# Patient Record
Sex: Male | Born: 1990 | Race: White | Hispanic: No | Marital: Single | Smoking: Former smoker
Health system: Southern US, Community
[De-identification: ages and names within clinical notes are randomized; demographics above are authoritative.]

## PROBLEM LIST (undated history)

## (undated) DIAGNOSIS — I10 Essential (primary) hypertension: Secondary | ICD-10-CM

## (undated) DIAGNOSIS — M549 Dorsalgia, unspecified: Secondary | ICD-10-CM

## (undated) DIAGNOSIS — M79606 Pain in leg, unspecified: Secondary | ICD-10-CM

## (undated) DIAGNOSIS — N186 End stage renal disease: Secondary | ICD-10-CM

## (undated) DIAGNOSIS — G8929 Other chronic pain: Secondary | ICD-10-CM

## (undated) DIAGNOSIS — N200 Calculus of kidney: Secondary | ICD-10-CM

## (undated) HISTORY — DX: Essential (primary) hypertension: I10

## (undated) HISTORY — PX: KIDNEY STONE SURGERY: SHX686

---
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
## 2008-03-05 ENCOUNTER — Inpatient Hospital Stay (HOSPITAL_COMMUNITY): Admission: EM | Admit: 2008-03-05 | Discharge: 2008-03-09 | Payer: Self-pay | Admitting: Emergency Medicine

## 2008-03-06 ENCOUNTER — Ambulatory Visit: Payer: Self-pay | Admitting: Gastroenterology

## 2008-03-07 ENCOUNTER — Ambulatory Visit: Payer: Self-pay | Admitting: Internal Medicine

## 2008-03-08 ENCOUNTER — Ambulatory Visit: Payer: Self-pay | Admitting: Internal Medicine

## 2008-06-12 ENCOUNTER — Emergency Department (HOSPITAL_COMMUNITY): Admission: EM | Admit: 2008-06-12 | Discharge: 2008-06-12 | Payer: Self-pay | Admitting: Emergency Medicine

## 2008-07-13 ENCOUNTER — Observation Stay (HOSPITAL_COMMUNITY): Admission: EM | Admit: 2008-07-13 | Discharge: 2008-07-15 | Payer: Self-pay | Admitting: Emergency Medicine

## 2009-07-28 ENCOUNTER — Emergency Department (HOSPITAL_COMMUNITY): Admission: EM | Admit: 2009-07-28 | Discharge: 2009-07-29 | Payer: Self-pay | Admitting: Emergency Medicine

## 2009-08-16 ENCOUNTER — Emergency Department (HOSPITAL_COMMUNITY): Admission: EM | Admit: 2009-08-16 | Discharge: 2009-08-16 | Payer: Self-pay | Admitting: Emergency Medicine

## 2009-09-04 ENCOUNTER — Emergency Department (HOSPITAL_COMMUNITY): Admission: EM | Admit: 2009-09-04 | Discharge: 2009-09-05 | Payer: Self-pay | Admitting: Emergency Medicine

## 2010-02-10 ENCOUNTER — Emergency Department (HOSPITAL_COMMUNITY): Admission: EM | Admit: 2010-02-10 | Discharge: 2010-02-10 | Payer: Self-pay | Admitting: Emergency Medicine

## 2010-08-04 LAB — URINALYSIS, ROUTINE W REFLEX MICROSCOPIC
Bilirubin Urine: NEGATIVE
Nitrite: NEGATIVE
Protein, ur: 100 mg/dL — AB
Specific Gravity, Urine: 1.025 (ref 1.005–1.030)

## 2010-08-04 LAB — URINE CULTURE: Colony Count: NO GROWTH

## 2010-08-04 LAB — URINE MICROSCOPIC-ADD ON

## 2010-08-05 LAB — URINALYSIS, ROUTINE W REFLEX MICROSCOPIC
Bilirubin Urine: NEGATIVE
Ketones, ur: NEGATIVE mg/dL
Leukocytes, UA: NEGATIVE
Nitrite: NEGATIVE
Urobilinogen, UA: 0.2 mg/dL (ref 0.0–1.0)

## 2010-08-05 LAB — CBC
Hemoglobin: 15.9 g/dL (ref 13.0–17.0)
MCV: 86 fL (ref 78.0–100.0)
RDW: 13.4 % (ref 11.5–15.5)
WBC: 10.9 10*3/uL — ABNORMAL HIGH (ref 4.0–10.5)

## 2010-08-05 LAB — DIFFERENTIAL
Basophils Relative: 0 % (ref 0–1)
Eosinophils Absolute: 0 10*3/uL (ref 0.0–0.7)
Eosinophils Relative: 0 % (ref 0–5)
Monocytes Absolute: 0.5 10*3/uL (ref 0.1–1.0)
Neutro Abs: 9.9 10*3/uL — ABNORMAL HIGH (ref 1.7–7.7)
Neutrophils Relative %: 91 % — ABNORMAL HIGH (ref 43–77)

## 2010-08-05 LAB — BASIC METABOLIC PANEL
CO2: 26 mEq/L (ref 19–32)
Calcium: 9.3 mg/dL (ref 8.4–10.5)
Chloride: 103 mEq/L (ref 96–112)
Creatinine, Ser: 2.15 mg/dL — ABNORMAL HIGH (ref 0.4–1.5)

## 2010-08-05 LAB — URINE MICROSCOPIC-ADD ON

## 2010-08-10 LAB — CBC
HCT: 44 % (ref 39.0–52.0)
Hemoglobin: 15 g/dL (ref 13.0–17.0)
MCV: 88.3 fL (ref 78.0–100.0)
RBC: 4.99 MIL/uL (ref 4.22–5.81)
RDW: 13.4 % (ref 11.5–15.5)
WBC: 7.2 10*3/uL (ref 4.0–10.5)

## 2010-08-10 LAB — URINALYSIS, ROUTINE W REFLEX MICROSCOPIC
Bilirubin Urine: NEGATIVE
Hgb urine dipstick: NEGATIVE
Specific Gravity, Urine: 1.015 (ref 1.005–1.030)
Urobilinogen, UA: 0.2 mg/dL (ref 0.0–1.0)

## 2010-08-10 LAB — POCT I-STAT, CHEM 8
Creatinine, Ser: 1.1 mg/dL (ref 0.4–1.5)
Glucose, Bld: 97 mg/dL (ref 70–99)
HCT: 47 % (ref 39.0–52.0)

## 2010-08-31 LAB — URINALYSIS, ROUTINE W REFLEX MICROSCOPIC
Bilirubin Urine: NEGATIVE
Glucose, UA: NEGATIVE mg/dL
Hgb urine dipstick: NEGATIVE
Ketones, ur: NEGATIVE mg/dL
Nitrite: NEGATIVE
Specific Gravity, Urine: 1.015 (ref 1.005–1.030)
Urobilinogen, UA: 0.2 mg/dL (ref 0.0–1.0)
pH: 8.5 — ABNORMAL HIGH (ref 5.0–8.0)

## 2010-08-31 LAB — COMPREHENSIVE METABOLIC PANEL
AST: 20 U/L (ref 0–37)
Albumin: 4.1 g/dL (ref 3.5–5.2)
BUN: 13 mg/dL (ref 6–23)
Calcium: 9.7 mg/dL (ref 8.4–10.5)
Creatinine, Ser: 0.96 mg/dL (ref 0.4–1.5)
GFR calc Af Amer: >60 mL/min (ref >60)
Total Protein: 7.3 g/dL (ref 6.0–8.3)

## 2010-08-31 LAB — CBC
HCT: 46.2 % (ref 39.0–52.0)
MCHC: 33.4 g/dL (ref 30.0–36.0)
MCV: 80.7 fL (ref 78.0–100.0)
Platelets: 246 10*3/uL (ref 150–400)
RDW: 17.4 % — ABNORMAL HIGH (ref 11.5–15.5)
WBC: 11.2 10*3/uL — ABNORMAL HIGH (ref 4.0–10.5)

## 2010-08-31 LAB — DIFFERENTIAL
Basophils Absolute: 0 10*3/uL (ref 0.0–0.1)
Eosinophils Relative: 1 % (ref 0–5)
Lymphocytes Relative: 3 % — ABNORMAL LOW (ref 12–46)
Lymphs Abs: 0.3 10*3/uL — ABNORMAL LOW (ref 0.7–4.0)
Monocytes Absolute: 0.4 10*3/uL (ref 0.1–1.0)
Neutro Abs: 10.5 10*3/uL — ABNORMAL HIGH (ref 1.7–7.7)

## 2010-08-31 LAB — LIPASE, BLOOD: Lipase: 19 U/L (ref 11–59)

## 2010-09-01 LAB — CBC
Hemoglobin: 13.4 g/dL (ref 13.0–17.0)
Hemoglobin: 14.1 g/dL (ref 13.0–17.0)
Hemoglobin: 8.8 g/dL — ABNORMAL LOW (ref 13.0–17.0)
MCHC: 33.3 g/dL (ref 30.0–36.0)
MCHC: 33.6 g/dL (ref 30.0–36.0)
Platelets: 222 10*3/uL (ref 150–400)
RBC: 3 MIL/uL — ABNORMAL LOW (ref 4.22–5.81)
RBC: 4.81 MIL/uL (ref 4.22–5.81)
RDW: 17.1 % — ABNORMAL HIGH (ref 11.5–15.5)
WBC: 7 10*3/uL (ref 4.0–10.5)
WBC: 9.2 10*3/uL (ref 4.0–10.5)

## 2010-09-01 LAB — IRON AND TIBC: TIBC: 332 ug/dL (ref 215–435)

## 2010-09-01 LAB — POCT I-STAT, CHEM 8
Calcium, Ion: 1.17 mmol/L (ref 1.12–1.32)
Creatinine, Ser: 1.3 mg/dL (ref 0.4–1.5)
Glucose, Bld: 85 mg/dL (ref 70–99)
HCT: 47 % (ref 39.0–52.0)
Hemoglobin: 16 g/dL (ref 13.0–17.0)
TCO2: 27 mmol/L (ref 0–100)

## 2010-09-01 LAB — URINALYSIS, ROUTINE W REFLEX MICROSCOPIC
Bilirubin Urine: NEGATIVE
Hgb urine dipstick: NEGATIVE
Nitrite: NEGATIVE
Specific Gravity, Urine: 1.015 (ref 1.005–1.030)
Urobilinogen, UA: 0.2 mg/dL (ref 0.0–1.0)
pH: 8 (ref 5.0–8.0)

## 2010-09-01 LAB — HEPATIC FUNCTION PANEL
ALT: 31 U/L (ref 0–53)
AST: 24 U/L (ref 0–37)
Albumin: 4.2 g/dL (ref 3.5–5.2)
Alkaline Phosphatase: 81 U/L (ref 39–117)
Bilirubin, Direct: 0.1 mg/dL (ref 0.0–0.3)
Total Bilirubin: 0.5 mg/dL (ref 0.3–1.2)

## 2010-09-01 LAB — TYPE AND SCREEN

## 2010-09-01 LAB — BASIC METABOLIC PANEL
Calcium: 8.7 mg/dL (ref 8.4–10.5)
Chloride: 108 mEq/L (ref 96–112)
Creatinine, Ser: 1.16 mg/dL (ref 0.4–1.5)
GFR calc Af Amer: >60 mL/min (ref >60)
Sodium: 141 mEq/L (ref 135–145)

## 2010-09-01 LAB — DIFFERENTIAL
Lymphs Abs: 2.1 10*3/uL (ref 0.7–4.0)
Lymphs Abs: 2.2 10*3/uL (ref 0.7–4.0)
Monocytes Absolute: 0.8 10*3/uL (ref 0.1–1.0)
Monocytes Relative: 6 % (ref 3–12)
Monocytes Relative: 8 % (ref 3–12)
Neutro Abs: 4 10*3/uL (ref 1.7–7.7)
Neutro Abs: 6.2 10*3/uL (ref 1.7–7.7)
Neutrophils Relative %: 58 % (ref 43–77)
Neutrophils Relative %: 67 % (ref 43–77)

## 2010-09-01 LAB — FOLATE: Folate: 13.8 ng/mL

## 2010-09-01 LAB — PREPARE RBC (CROSSMATCH)

## 2010-09-01 LAB — RETICULOCYTES: Retic Count, Absolute: 61.3 10*3/uL (ref 19.0–186.0)

## 2010-09-01 LAB — ABO/RH: ABO/RH(D): O POS

## 2010-09-16 ENCOUNTER — Emergency Department (HOSPITAL_COMMUNITY): Payer: Self-pay

## 2010-09-16 ENCOUNTER — Emergency Department (HOSPITAL_COMMUNITY)
Admission: EM | Admit: 2010-09-16 | Discharge: 2010-09-16 | Disposition: A | Discharge status: Home / Self Care | Payer: Self-pay | Attending: Emergency Medicine | Admitting: Emergency Medicine

## 2010-09-16 DIAGNOSIS — N200 Calculus of kidney: Secondary | ICD-10-CM | POA: Insufficient documentation

## 2010-09-16 DIAGNOSIS — K219 Gastro-esophageal reflux disease without esophagitis: Secondary | ICD-10-CM | POA: Insufficient documentation

## 2010-09-16 DIAGNOSIS — N390 Urinary tract infection, site not specified: Secondary | ICD-10-CM | POA: Insufficient documentation

## 2010-09-16 LAB — URINE MICROSCOPIC-ADD ON

## 2010-09-16 LAB — URINALYSIS, ROUTINE W REFLEX MICROSCOPIC
Glucose, UA: NEGATIVE mg/dL
Specific Gravity, Urine: 1.025 (ref 1.005–1.030)
pH: 5.5 (ref 5.0–8.0)

## 2010-09-18 LAB — URINE CULTURE: Culture: NO GROWTH

## 2010-09-21 ENCOUNTER — Emergency Department (HOSPITAL_COMMUNITY): Payer: Self-pay

## 2010-09-21 ENCOUNTER — Emergency Department (HOSPITAL_COMMUNITY)
Admission: EM | Admit: 2010-09-21 | Discharge: 2010-09-21 | Disposition: A | Discharge status: Other Acute Inpatient Hospital | Payer: Self-pay | Attending: Emergency Medicine | Admitting: Emergency Medicine

## 2010-09-21 DIAGNOSIS — N200 Calculus of kidney: Secondary | ICD-10-CM | POA: Insufficient documentation

## 2010-09-21 DIAGNOSIS — N19 Unspecified kidney failure: Secondary | ICD-10-CM | POA: Insufficient documentation

## 2010-09-21 DIAGNOSIS — R109 Unspecified abdominal pain: Secondary | ICD-10-CM | POA: Insufficient documentation

## 2010-09-21 LAB — BASIC METABOLIC PANEL
BUN: 46 mg/dL — ABNORMAL HIGH (ref 6–23)
Calcium: 10.1 mg/dL (ref 8.4–10.5)
Chloride: 104 mEq/L (ref 96–112)
Creatinine, Ser: 5.24 mg/dL — ABNORMAL HIGH (ref 0.4–1.5)
GFR calc Af Amer: 17 mL/min — ABNORMAL LOW (ref >60)

## 2010-09-21 LAB — DIFFERENTIAL
Basophils Absolute: 0 10*3/uL (ref 0.0–0.1)
Basophils Relative: 0 % (ref 0–1)
Eosinophils Absolute: 0.1 10*3/uL (ref 0.0–0.7)
Monocytes Absolute: 0.8 10*3/uL (ref 0.1–1.0)
Monocytes Relative: 8 % (ref 3–12)
Neutro Abs: 8.5 10*3/uL — ABNORMAL HIGH (ref 1.7–7.7)
Neutrophils Relative %: 82 % — ABNORMAL HIGH (ref 43–77)

## 2010-09-21 LAB — CBC
MCH: 27.7 pg (ref 26.0–34.0)
MCHC: 32.4 g/dL (ref 30.0–36.0)
Platelets: 302 10*3/uL (ref 150–400)
RBC: 5.09 MIL/uL (ref 4.22–5.81)

## 2010-09-29 NOTE — Consult Note (Signed)
NAMEPERLE, GIBBON NO.:  [id number]   MEDICAL RECORD NO.:  [id number]          PATIENT TYPE:  INP   LOCATION:  A336                          FACILITY:  [facility]   PHYSICIAN:  Dennie Maizes, M.D.   DATE OF BIRTH:  [birth year]   DATE OF CONSULTATION:  07/14/2008  DATE OF DISCHARGE:                                 CONSULTATION   REASON FOR CONSULTATION:  Right flank pain, bilateral renal calculi.   HISTORY OF PRESENT ILLNESS:  A 20 year old male has history of recurrent  urolithiasis.  He has had kidney stones for about 3 years.  He has been  under the care of Dr. Teofilo Pod at [facility].  He  has undergone bilateral percutaneous nephrostolithotomies in September  2009.  He had been recently admitted to the hospital with severe  intermittent right flank pain.  The patient denied having any voiding  difficulty, fever, chills, or gross hematuria at present.  He has been  evaluated with a noncontrast CT scan of the abdomen and pelvis.  This  revealed a 7.1-mm size right renal calculus and 19-mm size left renal  calculus.  There was no evidence of hydronephrosis.  No ureteral calculi  have been noted.   PAST MEDICAL HISTORY:  Bilateral large renal calculi status post  bilateral percutaneous nephrostolithotomies, history of panic attacks,  acid reflux, and depression.   MEDICATIONS:  Prilosec.   ALLERGIES:  None.   PHYSICAL EXAMINATION:  The patient is comfortable at the time of  examination.  Abdomen is soft.  No palpable flank mass.  Mild right  costovertebral angle tenderness is noted.  Bladder not palpable.  Penis  and testes are normal.   ADMISSION LABORATORY DATA:  CBC, WBC 7.0, hemoglobin 13.4, hematocrit  39.7.  BUN 14, creatinine 1.16.   IMPRESSION:  Right renal colic, bilateral renal calculi without  obstruction.   PLAN:  1. We will check an x-ray at KUB area  for migration of the stone.  2. We will discuss with the hospitalist  regarding management options.      The patient  plans to go to to [facility] for      further care.   Thanks for this consult.       Dennie Maizes, M.D.  Electronically Signed     SK/MEDQ  D:  07/14/2008  T:  07/14/2008  Job:  [id number]

## 2010-09-29 NOTE — Group Therapy Note (Signed)
NAMEBURNELL, Manuel NO.:  [id number]   MEDICAL RECORD NO.:  [id number]          PATIENT TYPE:  INP   LOCATION:  A336                          FACILITY:  [facility]   PHYSICIAN:  Dennie Maizes, M.D.   DATE OF BIRTH:  Feb 10, 1991   DATE OF PROCEDURE:  07/15/2008  DATE OF DISCHARGE:  07/15/2008                                 PROGRESS NOTE   This 20 year old male has a history of recurrent urolithiasis who has  undergone bilateral percutaneous nephrostolithotomy by Dr. Teofilo Pod at  [facility] last year.  He has been admitted to the  hospital ICU with right flank pain.  CT scan revealed a 7-mm sized stone  in the right kidney and 19-mm sized stone on the left kidney without  obstruction.  The patient has had some relief of pain today.  He still  has mild intermittent right flank pain.  He has not passed any stone in  the urine.   PHYSICAL EXAMINATION:  ABDOMEN:  Soft.   X-ray of the KUB area done today revealed a 20 x 19 mm sized left renal  calculus.  The right renal calculi was not seen.  No definite ureteral  calculi was noted.  The patient has 2 small phleboliths in the pelvic  cavity.   IMPRESSION:  Large left renal calculus, right flank pain, right renal  calculus nonobstructing.   PLAN:  The patient can be discharged with pain medication.  He will make  an appointment to see Dr. Teofilo Pod for followup of the left renal  calculus.      Dennie Maizes, M.D.  Electronically Signed     SK/MEDQ  D:  07/15/2008  T:  07/16/2008  Job:  [id number]

## 2010-09-29 NOTE — Discharge Summary (Signed)
Manuel Riggs, MENG NO.:  [id number]   MEDICAL RECORD NO.:  [id number]          PATIENT TYPE:  INP   LOCATION:  A308                          FACILITY:  [facility]   PHYSICIAN:  Osvaldo Shipper, MD     DATE OF BIRTH:  [birth year]   DATE OF ADMISSION:  03/05/2008  DATE OF DISCHARGE:  10/24/2009LH                               DISCHARGE SUMMARY   Please review H&P dictated by Dr. Skeet Latch for details regarding  the patient's presenting illness.   His PMD is unknown.   DISCHARGE DIAGNOSES:  1. Colitis.  Likely infectious, improved.  2. Nephrolithiasis under care of urologist at [facility].  3. History of panic attacks and depression.   BRIEF HOSPITAL COURSE:  Briefly, this is a 20 year old Caucasian male  who presented with complaints of abdominal pain.  He had undergone  nephrostomy placement at [facility] about 2 weeks ago.  The  patient was evaluated in the ED and he underwent CT scan of his abdomen  and pelvis, which showed nonobstructive bilateral renal calculi,  however, it also showed thickening of small bowel loops in the right  lower quadrant, which was suspicious for enteritis, colitis kind of  picture.  The patient was admitted to the hospital and was started on IV  antibiotics.  He was seen by gastroenterology.  He did not have any  diarrhea, however.  He did not mention that he was having diarrhea prior  to his presentation to the hospital.  His blood work show that his white  count was elevated at 12,900.  Otherwise rest of his labs were  unremarkable.  With antibiotics, his white count had come down to  normal.  His abdominal pain also slowly improved.  He was started on a  diet yesterday and he has tolerated that quite well.  He is this morning  denying any nausea, vomiting, or diarrhea.  So overall, the patient has  improved and if his BMET this morning is okay and if he is cleared by  GI, he can be discharged home.   His other  medical issues include nephrolithiasis for which he needs to  follow up with his physician's at [facility].  He also has  panic attack and depression as well as GERD.  All those issues were  stable.   On the day of discharge, he is feeling well.  No complaints were  offered.  Vital signs were all stable.  Examination was benign.  So, he  is stable for discharge.   DISCHARGE MEDICATIONS:  1. Cipro 500 mg p.o. b.i.d. for 7 days.  2. Flagyl 500 mg p.o. t.i.d. for 7 days.  3. Percocet 5/325 one tablet p.o. q.6 h. p.r.n. for pain, 20 tablets      prescribed.  4. Flora-Q 1 capsule once daily for 7 days.  5. Prilosec over-the-counter twice daily.  6. Prozac 20 mg once daily.   FOLLOWUP:  Follow up with GI in 4 weeks.  With his PMD and urologist as well within the next month or so.   DIET:  Low residue diet.   PHYSICAL ACTIVITY:  No restrictions.   Total time on this discharge, encountered about 40 minutes.      Osvaldo Shipper, MD  Electronically Signed     GK/MEDQ  D:  03/09/2008  T:  03/09/2008  Job:  [id number]   cc:   R. Roetta Sessions, M.D.  [id number]  [place]  [place] [address]

## 2010-09-29 NOTE — H&P (Signed)
Manuel Riggs, Manuel Riggs                 ACCOUNT NO.:  [id number]   MEDICAL RECORD NO.:  [id number]          PATIENT TYPE:  INP   LOCATION:  A336                          FACILITY:  [facility]   PHYSICIAN:  Margaretmary Dys, M.D.DATE OF BIRTH:  [birth year]   DATE OF ADMISSION:  07/13/2008  DATE OF DISCHARGE:  LH                              HISTORY & PHYSICAL   ADMISSION DIAGNOSES:  1. Acute abdominal pain.  2. History of staghorn kidney calculi status post ureterostomy tube      placement at [facility].  3. Questionable history of anemia with repeat H and H now normal.   HISTORY OF PRESENT ILLNESS:  Mr. Manuel Riggs is a 20 year old male who has a  history of staghorn calculus.  The patient apparently has had this since  he was 20 years old and had staghorn calculus and the patient had  ureterostomy tube placed in [facility].  The patient arrived  in the emergency room and complained of pain which has been worsening  over the past week.  The patient stated he had been taking some over-the-  counter pain medications but denies any narcotic use.  The patient's  pain is a stabbing pain mostly in his right upper quadrant and also his  right flank.  The pain waxes and wanes lasting from 5-10 minutes.  The  patient had had bilateral nephrostomies placed with complications of  pneumothorax which happened actually twice.  He had chest tubes placed  on the left side.  The patient describes pain and occasional colicky  with crampy.  The patient denies any nausea or vomiting but in general  has had a very poor appetite.  When the patient was evaluated in the  emergency room the patient received some pain medication with apparently  helped him.  However, H and H obtained revealed a hemoglobin of 8 which  was a big drop from 14 several months ago.  As a result there was a  concern that the patient may have a gastrointestinal bleed.  A rectal  exam was negative with no occult blood or gross blood seen.   However,  repeat subsequently after the patient was admitted was back to normal.   REVIEW OF SYSTEMS:  The patient denies any weight loss.  Mostly as  mentioned in history of present illness above he denies any frequency,  dysuria, urgency, no fever, no weight gains, no nausea or vomiting.  No  bloody stools.   PAST MEDICAL HISTORY:  1. 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  2. History of panic attacks.  3. Acid reflux.  4. Depression.   MEDICATIONS:  The patient takes Prilosec as needed.  He does not really  take it regularly.  A review of his records indicate that he was  supposed to be on Prozac and he has not taken this for awhile.   ALLERGIES:  He denies any known drug allergies.   SOCIAL HISTORY:  The patient is single, lives at home, drinks  occasionally some beer, uses occasional marijuana, smokes about  2-3  cigarettes daily.  The patient is currently not working.  He is looking  for work.  He is not in college.   PHYSICAL EXAM:  GENERAL:  The patient was conscious, alert, appeared to  be in some mild pain distress.  VITAL SIGNS:  Blood pressure was 117/73 with a pulse of 76, respirations  20, temperature 97.7 degrees Fahrenheit, oxygen saturation was 96% on  room air.  HEENT:  Normocephalic, atraumatic.  Oral mucosa was moist with no  exudates.  NECK:  Supple.  No JVD or lymphadenopathy.  LUNGS:  Were clear clinically with good air entry bilaterally.  HEART:  S1-S2 regular.  No S3, S4, gallops or rubs.  ABDOMEN:  Abdomen was soft.  The patient had some mild tenderness in the  right renal angle.  No rebound.  No guarding.  No rigidity.  Bowel  sounds were positive.  EXTREMITIES:  No edema.  CNS:  Exam was grossly intact with no focal neurological deficits.   LABORATORY/DIAGNOSTIC DATA:  White blood count 6.5, hemoglobin of 14,  hematocrit 42, platelet count was 222 with 67% neutrophils.  Sodium is  142,  potassium 3.8, chloride of 105, CO2 is 85, BUN of 18, creatinine  was 1.3, AST was 24, ALT was 31, albumin was 4.2.  Urinalysis was  negative.   ASSESSMENT:  Mr. Manuel Riggs is a 20 year old male with history of staghorn  calculus stone who is status post bilateral nephrostomy who presents  with acute abdominal pain.  A CT scan of his abdomen and pelvis shows  bilateral nephrolithiasis but no evidence of obstructive uropathy.  There were no acute pelvic CT changes.   PLAN:  1. The patient will be admitted for pain control.  2. The patient will be transferred back to [facility] if the patient continues to have any significant pain      although I have assured him he does not have an obstructive      calculus at this time.  3. Will place the patient on DVT prophylaxis with Lovenox.  4. Will discontinue type and cross match screen ordered in the      emergency room and discontinue gastrointestinal consult.  5. The patient is currently in observation status and I think may be      discharged in the next 24 hours if pain improves.  6. It is entirely possible that the patient may be having occasional      passage of some of his stones causing his symptoms.  We will see if      he will benefit from a urology consult here locally.  7. I explained the above plan to the patient in detail.      Margaretmary Dys, M.D.  Electronically Signed     AM/MEDQ  D:  07/13/2008  T:  07/13/2008  Job:  [id number]

## 2010-09-29 NOTE — Consult Note (Signed)
NAMESAWYER, MENTZER NO.:  [id number]   MEDICAL RECORD NO.:  [id number]          PATIENT TYPE:  INP   LOCATION:  A308                          FACILITY:  [facility]   PHYSICIAN:  Kassie Mends, M.D.      DATE OF BIRTH:  [birth year]   DATE OF CONSULTATION:  03/06/2008  DATE OF DISCHARGE:                                 CONSULTATION   DATE OF SERVICE:  March 06, 2008.   REASON FOR CONSULTATION:  Abdominal pain.   HISTORY OF PRESENT ILLNESS:  Mr. Vayda is a 20 year old male who  presented with sudden onset of right lower quadrant pain.  He describes  it as sharp.  It does not radiate.  He denies fever or chills.  He had 1  episode of diarrhea.  He has vomited 3 times.  He saw no blood in his  vomit.  His nausea has been mild, but the same since the onset of his  symptoms.  He denies using ibuprofen, Motrin, BC's are Goody powders.  He has had no bowel movement today.  He denies any sores in his mouth or  rash on his legs.  He has no heartburn or indigestion.  He has not seen  any blood in his stool.  He has not traveled anywhere.  He does have  well water.  He was hospitalized approximately 3 weeks ago.   PAST MEDICAL HISTORY:  1. Kidney stones.  2. Panic disorder.  3. Depression.   PAST SURGICAL HISTORY:  Bilateral nephrostomy tubes for bilateral  staghorn calculi.   ALLERGIES:  NO KNOWN DRUG ALLERGIES.   MEDICATIONS:  1. Cipro.  2. Flagyl.  3. Lovenox.  4. Protonix  5. Dilaudid as needed for pain (4 mg on March 05, 2008).   SOCIAL HISTORY:  He does drink alcohol.  He occasionally uses marijuana.  He smokes 2-3 cigarettes a day.   FAMILY HISTORY:  He denies any family history of colon cancer, colon  polyps or inflammatory bowel disease.   REVIEW OF SYSTEMS:  Per the HPI, otherwise all systems are negative.   PHYSICAL EXAMINATION:  VITAL SIGNS:  T-max is 97.6, blood pressure  128/79, pulse 98, O2 sat 97% on room air.  GENERAL:  He is in no apparent  distress, alert and oriented x4.  HEENT:  Atraumatic, normocephalic.  Pupils are equal and reactive to  light.  Mouth:  No oral lesions.  Posterior pharynx without erythema or  exudate.  NECK:  Full range of motion.  No lymphadenopathy.  LUNGS:  Clear to auscultation bilaterally.  CARDIOVASCULAR:  Regular rhythm, no murmur, normal S1-S2.  ABDOMEN:  Bowel sounds are present, soft, mild tenderness to palpation  in the right upper quadrant and right lower quadrant without rebound or  guarding.  EXTREMITIES:  No cyanosis or edema.  SKIN:  He has no pretibial lesions.  NEURO:  He has no focal neurologic deficit.   LABORATORY DATA:  White count 12.9 down to 7.5, hemoglobin 11.2,  platelets 246.  His hepatic function panel is normal.  His UA is  negative.  No stool studies have been obtained.   RADIOGRAPHY:  CT scan of the abdomen and pelvis with contrast reveals  bilateral kidney stones.  He has bowel wall thickening of the terminal  ileum extending to the ileocecal valve.   ASSESSMENT:  Mr. Garriga is a 20 year old male with sudden onset of right  lower quadrant pain associated with diarrhea and vomiting.  The  differential diagnosis includes infectious ileitis or inflammatory bowel  disease.   Thank you for allowing me to see Mr. Ribas in consultation.  My  recommendations follow.   RECOMMENDATIONS:  1. May have clear liquids.  He may also have sherbet.  Would continue      the Cipro and the Flagyl.  2. I have encouraged Mr. Mittelman that if he has a bowel movement to let      the nursing staff know so that we can obtain stool studies.  3. If his symptoms do not improve, then would consider an      ileocolonoscopy.  4. Continue Protonix daily.   ADDENDUM [id number]:  Improved. OPV w/ SLM AFTER 04/03/08      Kassie Mends, M.D.  Electronically Signed     SM/MEDQ  D:  03/07/2008  T:  03/07/2008  Job:  [id number]

## 2010-09-29 NOTE — Discharge Summary (Signed)
Manuel Riggs, Manuel Riggs NO.:  [id number]   MEDICAL RECORD NO.:  [id number]          PATIENT TYPE:  INP   LOCATION:  A336                          FACILITY:  [facility]   PHYSICIAN:  Dorris Singh, DO    DATE OF BIRTH:  1990/11/11   DATE OF ADMISSION:  07/13/2008  DATE OF DISCHARGE:  03/01/2010LH                               DISCHARGE SUMMARY   PRIMARY CARE PHYSICIAN:  He has none.   RADIOLOGY TESTS THAT WERE DONE:  Include on the July 13, 2008, he  had a CT of the abdomen and pelvis which demonstrated bilateral  nephrolithiasis.  No evidence of obstructive uropathy and no acute  pelvic findings and he had a KUB of the abdomen today which showed large  left lower pole renal calculus measuring 2.1 x 3.1 cm.   His H and P was done by Dr. Sherle Poe.   ADMISSION DIAGNOSES:  1. Acute abdominal pain.  2. History of staghorn kidney calculi status post urostomy tube      placement at [facility].  3. Questionable history of anemia.   DISCHARGE DIAGNOSES:  1. Bilateral kidney stones, patient has a history of this.  2. Acid reflux.  3. History of panic attacks.  4. Depression.   HPI:  You can refer to the H and P.  He was admitted for the above  diagnoses.  He was placed on pain management and admitted to the service  of [facility].  A CT of the abdomen and pelvis showed that he did have a  current kidney stone.  He was placed on DVT and GI prophylaxis.  Neurology was consulted to see him.  Patient has been going to [facility]  for all of his urology care and Dr. Rito Ehrlich did come and see him and  recommend we continue the current management.  Patient did have a repeat  x-ray done, KUB, which you saw above results.  At this point in time, on  July 15, 2008, it was determined that he could be discharged to home.  He is to follow up with his [facility].  We can set up an  appointment for him if he so chooses and he is to continue his care  there if he has any other  issues due to the fact that they have been  taking care of him up until this time.   PLAN:  He will be discharged to home.  He is to follow up with his  [facility].  We will give him some pain medications, Darvocet-N  100, #20, and he is to follow back up with them or if he needs to be  readmitted we recommend that he go to [facility] due to his longstanding  history with them and the fact that they have all of his records there.   CONDITION:  Stable.   DISPOSITION:  Will be to home.      Dorris Singh, DO  Electronically Signed     CB/MEDQ  D:  07/15/2008  T:  07/15/2008  Job:  [id number]

## 2010-09-29 NOTE — Group Therapy Note (Signed)
NAME:  SIRE, POET NO.:  [id number]   MEDICAL RECORD NO.:  [id number]          PATIENT TYPE:  INP   LOCATION:  A336                          FACILITY:  [facility]   PHYSICIAN:  Margaretmary Dys, M.D.DATE OF BIRTH:  1990/10/31   DATE OF PROCEDURE:  07/14/2008  DATE OF DISCHARGE:                                 PROGRESS NOTE   SUBJECTIVE:  The patient feels a little bit better today, still  reporting his pain to be 9/10, mostly in the right renal area.  The  patient has not seen any blood in his urine.   OBJECTIVE:  GENERAL:  Conscious, alert, comfortable, not in acute  distress.  Well oriented in time, place, and person.  VITAL SIGNS:  Blood pressure 105/63 with a pulse of 74, respirations 18,  temperature 98.2 degrees Fahrenheit, oxygen saturation 92% on room air.  HEENT:  Normocephalic, atraumatic.  Oral mucosa was dry.  No exudates  were noted.  NECK:  Supple.  No JVD or lymphadenopathy.  LUNGS:  Clear clinically, good air entry bilaterally.  HEART:  S1-S2 regular, no S3, S4, gallops or rubs.  ABDOMEN:  Soft.  The patient has some tenderness in the right renal  angle.  No guarding or rigidity was noted.  CENTRAL NERVOUS SYSTEM:  Grossly intact.   LABORATORY/DIAGNOSTICS:  White blood cell count 7, hemoglobin 13.4,  hematocrit 39.7, platelet count 234 with no left shift.  Sodium is 141,  potassium is 4, chloride of 108, CO2 of 26, glucose 121, BUN of 14,  creatinine 1.16, calcium 8.7.   ASSESSMENT:  1. Bilateral nephrolithiasis, status post nephrostomy tube placement      at [facility].  2. Acute on chronic abdominal pain.   PLAN:  1. Will increase his IV fluids normal saline to 150 mL an hour.  2. Continue pain control with Dilaudid and Toradol.  3. Will request a urology consult to see him.  It is noted from his      previous CT scan that the patient had nonobstructive kidney stones      bilaterally.  However, perhaps the patient is trying  to pass those      stones.  We will see if urology can give [place] any input as to the      next course of action.  4. The patient otherwise remains stable.  Continue on DVT prophylaxis      with Lovenox.     Margaretmary Dys, M.D.  Electronically Signed    AM/MEDQ  D:  07/14/2008  T:  07/14/2008  Job:  [id number]

## 2010-09-29 NOTE — H&P (Signed)
**Note Manuel Riggs** [name], Manuel NO.:  [id number]   MEDICAL RECORD NO.:  [id number]          PATIENT TYPE:  INP   LOCATION:  A308                          FACILITY:  [facility]   PHYSICIAN:  Skeet Latch, DO    DATE OF BIRTH:  1991-04-14   DATE OF ADMISSION:  03/05/2008  DATE OF DISCHARGE:  LH                              HISTORY & PHYSICAL   CHIEF COMPLAINTS:  Abdominal pain.   HISTORY OF PRESENT ILLNESS:  This is a 20 year old Caucasian male who  presents with complaint of abdominal pain.  The patient states that  yesterday he was awoken with severe abdominal pain that he describes as  a stabbing pain.  The patient states that the pain waxes and wanes.  It  can last anywhere from 5 to 20 minutes at a time.  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  The patient  was seen at [facility] and had bilateral nephrostomies placed.  The patient states that during the hospital stay he had a pneumothorax  and had a chest tube placed.  The patient was then home for a few weeks  and started having this abdominal pain that started yesterday that he  describes as a 10 out of 10 in nature and is located in his right lower  abdomen area.  He also describes as colicky crampy-type of pain.  The  patient states that he has not eaten in 1-2 days also.   PAST MEDICAL HISTORY:  1. Positive for nephrolithiasis.  2. Panic attacks.  3. Acid reflux.  4. Depression.   SURGICAL HISTORY:  Staghorn calculus with bilateral nephrostomy.   SOCIAL HISTORY:  He is a social drinker.  Admits to occasional marijuana  use.  States that he smokes 2-3 cigarettes daily for last 2-3 years.   ALLERGIES:  No known drug allergies.   FAMILY HISTORY:  Unremarkable.   MEDICATIONS:  1. He takes Prilosec daily.  2. He takes Prozac 20 mg daily.   REVIEW OF SYSTEMS:  CONSTITUTIONAL:  No fever, weight gain, weight loss  or chills.  CARDIOVASCULAR:  Unremarkable.  RESPIRATORY:   Unremarkable.  GASTROINTESTINAL:  Positive for abdominal pain.  No nausea or vomiting.  No bloody stools.  MUSCULOSKELETAL:  Positive for some right flank pain.  GENITOURINARY:  No dysuria, urgency, frequency.  Other systems are  unremarkable.   PHYSICAL EXAM:  Well-nourished, well-hydrated, well-developed.  No acute  distress.  HEENT:  Head is atraumatic, normocephalic.  Eyes are PERRLA, EOMI.  NECK:  Soft, supple, nontender, nondistended, oral mucosa moist.  CARDIOVASCULAR:  Regular rate and rhythm.  No murmurs, rubs or gallops.  LUNGS:  Clear auscultation bilaterally.  No rales, rhonchi or wheezes.  ABDOMEN:  Soft.  He does have some right lower quadrant pain on deep  palpation.  No rigidity or guarding.  No organomegaly.  Positive bowel  sounds.  EXTREMITIES:  No clubbing, cyanosis or edema.  NEUROLOGIC:  Cranial nerves II-XII grossly intact.  Patient alert and  oriented x3.   LABS:  Urinalysis positive for ketones, no  nitrites or leukocytes are  noted.  Sodium 140, potassium 4.0, chloride 105, CO2 is 26, glucose 101,  BUN 11, creatinine 0.94.  Alkaline phosphatase 71, AST is 23, ALT is 39,  total protein 7.4.  White count is 12.9, hemoglobin 12.8, hematocrit  37.9, platelet count 317.   RADIOLOGIC STUDIES:  1. CT of his abdomen and pelvis showed nonobstructive bilateral renal      calculi with small left renal cyst and minimal renal scarring.      Pelvis showed thickening of small bowel loops in the right lower      quadrant, question of Crohn disease, infection, unlikely ischemia      in a patient of hisage.  2. No evidence of bowel obstruction or perforation.  3. Small amount of nonspecific pelvic fluid.  4. Question tiny calculus within the urinary bladder.   ASSESSMENT:  1. Abdominal pain.  2. Crohn disease versus an infectious process.  3. History of nephrolithiasis.  4. History of depression.  5. Leukocytosis.   PLAN:  1. The patient will be admitted to service  [facility] to a general      medical bed.  2. For his abdominal pain, the patient will be placed on IV pain      medication.  We will IV hydrate the patient also at this time.  The      patient will kept n.p.o. except for medications at this time with      sips of water.  3. Crohn disease versus an infectious process.  We will get a      gastroenterology consult at this time.  We will await their      recommendations.  The patient will placed on IV antibiotics      empirically also at this time.  4. For his depression, the patient will be placed on his Prozac dose      at this time.  5. Lastly we will get stool cultures as well as a Clostridium      difficile culture.  We will also get a blood culture x2.  The      patient will be placed on antiemetics and we will get a.m. labs.      Skeet Latch, DO  Electronically Signed     SM/MEDQ  D:  03/06/2008  T:  03/06/2008  Job:  [id number]

## 2010-09-29 NOTE — Group Therapy Note (Signed)
NAMEEDEM, TIEGS NO.:  [id number]   MEDICAL RECORD NO.:  [id number]          PATIENT TYPE:  INP   LOCATION:  A336                          FACILITY:  [facility]   PHYSICIAN:  Skeet Latch, DO    DATE OF BIRTH:  [birth year]   DATE OF PROCEDURE:  07/15/2008  DATE OF DISCHARGE:                                 PROGRESS NOTE   SUBJECTIVE:  Mr. Manuel Riggs states that he is feeling better.  The patient  is asleep at the time of my exam, very groggy but he states that he is  feeling better.  He is not complaining of any severe pain at this time.   OBJECTIVE:  VITAL SIGNS:  Temperature 99, pulse 79, respirations 20,  blood pressure 130/79, satting 97% on room air.  CARDIOVASCULAR: Regular rate and rhythm. No murmurs, rubs, or gallops.  LUNGS:  Clear to auscultation bilaterally. No rales, rhonchi, or  wheezing.  ABDOMEN: Soft. No tenderness on deep palpation.  Positive bowel sounds.  EXTREMITIES:  No clubbing, cyanosis, or edema   LABS:  White count 7000, hemoglobin 13.4, hematocrit 39.7, platelet  count 234,000. Sodium 141, potassium 4, chloride 108, CO2 of 26, glucose  121, BUN 14, creatinine 1.16.   ASSESSMENT AND PLAN:  1. Bilateral nephrolithiasis status post nephrostomy tube place at      [facility].  Will continue with IV fluids as well as pain      control with Dilaudid and Toradol.  2. Acute and chronic abdominal pain.  Again will continue with pain      medications.  3. I believe urology to see the patient.  Will discuss with them      current treatment plans at this time. Will continue with DVT and GI      prophylaxis.  Anticipate the patient being discharged fairly soon      if no intervention is needed.      Skeet Latch, DO  Electronically Signed     SM/MEDQ  D:  07/15/2008  T:  07/15/2008  Job:  [id number]

## 2010-10-01 ENCOUNTER — Emergency Department (HOSPITAL_COMMUNITY)
Admission: EM | Admit: 2010-10-01 | Discharge: 2010-10-01 | Disposition: A | Discharge status: Home / Self Care | Payer: Self-pay | Attending: Emergency Medicine | Admitting: Emergency Medicine

## 2010-10-01 DIAGNOSIS — R300 Dysuria: Secondary | ICD-10-CM | POA: Insufficient documentation

## 2010-10-01 DIAGNOSIS — N200 Calculus of kidney: Secondary | ICD-10-CM | POA: Insufficient documentation

## 2010-10-01 LAB — DIFFERENTIAL
Basophils Relative: 0 % (ref 0–1)
Eosinophils Absolute: 0.4 10*3/uL (ref 0.0–0.7)
Neutro Abs: 4.8 10*3/uL (ref 1.7–7.7)
Neutrophils Relative %: 67 % (ref 43–77)

## 2010-10-01 LAB — BASIC METABOLIC PANEL
CO2: 27 mEq/L (ref 19–32)
Chloride: 102 mEq/L (ref 96–112)
GFR calc Af Amer: 49 mL/min — ABNORMAL LOW (ref >60)
Potassium: 4.4 mEq/L (ref 3.5–5.1)
Sodium: 139 mEq/L (ref 135–145)

## 2010-10-01 LAB — CBC
Hemoglobin: 11.7 g/dL — ABNORMAL LOW (ref 13.0–17.0)
Platelets: 379 10*3/uL (ref 150–400)
RBC: 4.18 MIL/uL — ABNORMAL LOW (ref 4.22–5.81)
WBC: 7.2 10*3/uL (ref 4.0–10.5)

## 2011-01-10 ENCOUNTER — Emergency Department (HOSPITAL_COMMUNITY)
Admission: EM | Admit: 2011-01-10 | Discharge: 2011-01-10 | Disposition: A | Discharge status: Home / Self Care | Payer: Self-pay | Attending: Emergency Medicine | Admitting: Emergency Medicine

## 2011-01-10 DIAGNOSIS — F17200 Nicotine dependence, unspecified, uncomplicated: Secondary | ICD-10-CM | POA: Insufficient documentation

## 2011-01-10 DIAGNOSIS — M549 Dorsalgia, unspecified: Secondary | ICD-10-CM | POA: Insufficient documentation

## 2011-01-10 DIAGNOSIS — M79609 Pain in unspecified limb: Secondary | ICD-10-CM | POA: Insufficient documentation

## 2011-01-10 DIAGNOSIS — M436 Torticollis: Secondary | ICD-10-CM | POA: Insufficient documentation

## 2011-01-10 DIAGNOSIS — Z87442 Personal history of urinary calculi: Secondary | ICD-10-CM | POA: Insufficient documentation

## 2011-01-10 HISTORY — DX: Pain in leg, unspecified: M79.606

## 2011-01-10 HISTORY — DX: Other chronic pain: G89.29

## 2011-01-10 HISTORY — DX: Other chronic pain: M54.9

## 2011-01-10 HISTORY — DX: Calculus of kidney: N20.0

## 2011-01-10 MED ORDER — DIAZEPAM 5 MG PO TABS
5.0000 mg | ORAL_TABLET | Freq: Once | ORAL | Status: AC
  Administered 2011-01-10: 5 mg via ORAL
  Filled 2011-01-10: qty 1

## 2011-01-10 MED ORDER — CYCLOBENZAPRINE HCL 10 MG PO TABS: 10.0000 mg | ORAL_TABLET | Freq: Two times a day (BID) | ORAL | Status: AC | PRN

## 2011-01-10 MED ORDER — IBUPROFEN 800 MG PO TABS: 800.0000 mg | ORAL_TABLET | Freq: Three times a day (TID) | ORAL | Status: AC

## 2011-01-10 MED ORDER — IBUPROFEN 800 MG PO TABS
800.0000 mg | ORAL_TABLET | Freq: Once | ORAL | Status: AC
  Administered 2011-01-10: 800 mg via ORAL
  Filled 2011-01-10: qty 1

## 2011-01-10 NOTE — ED Notes (Signed)
Pt said he took a Vicodin 5mg  just before arrival to the ER.

## 2011-01-10 NOTE — ED Provider Notes (Signed)
History     CSN: [id number] Arrival date & time: 01/10/2011  2:27 AM  Chief Complaint  Patient presents with  . Torticollis   Patient is a 20 y.o. male presenting with musculoskeletal pain. The history is provided by the patient.  Muscle Pain This is a new problem. The current episode started 12 to 24 hours ago. The problem occurs constantly. The problem has not changed since onset.Pertinent negatives include no chest pain, no abdominal pain, no headaches and no shortness of breath. The symptoms are aggravated by twisting. The symptoms are relieved by nothing. He has tried nothing for the symptoms. The treatment provided no relief.   Woke up with R sided neck pain that hurts to move. No associated weakness or numbness. No h/o same, he denies any trauma and believes he slept on his neck wrong.  Past Medical History  Diagnosis Date  . Kidney calculi   . Chronic back pain   . Chronic leg pain     Past Surgical History  Procedure Date  . Kidney stone surgery     No family history on file.  History  Substance Use Topics  . Smoking status: Current Everyday Smoker    Types: Cigarettes  . Smokeless tobacco: Not on file  . Alcohol Use: No      Review of Systems  Constitutional: Negative for fever and chills.  HENT: Positive for neck pain. Negative for sore throat, trouble swallowing and voice change.   Eyes: Negative for pain.  Respiratory: Negative for shortness of breath.   Cardiovascular: Negative for chest pain.  Gastrointestinal: Negative for abdominal pain.  Genitourinary: Negative for dysuria.  Musculoskeletal: Negative for back pain.  Skin: Negative for rash.  Neurological: Negative for headaches.  All other systems reviewed and are negative.    Physical Exam  BP 143/82  Pulse 97  Temp(Src) 98.6 F (37 C) (Oral)  Resp 18  Ht 5\' 8"  (1.727 m)  Wt 163 lb (73.936 kg)  BMI 24.78 kg/m2  SpO2 98%  Physical Exam  Constitutional: He is oriented to person, place,  and time. He appears well-developed and well-nourished.  HENT:  Head: Normocephalic and atraumatic.  Eyes: Conjunctivae and EOM are normal. Pupils are equal, round, and reactive to light.  Neck: Full passive range of motion without pain. No tracheal deviation present. No thyromegaly present.       R trapezial muscle spasm and TTP, no erythema or edema, limited ROM 2/2 pain. No midline tenderness or deformity.   Cardiovascular: Normal rate, regular rhythm, S1 normal, S2 normal and intact distal pulses.   Pulmonary/Chest: Effort normal and breath sounds normal. No stridor.  Abdominal: Soft. Bowel sounds are normal. There is no tenderness. There is no CVA tenderness.  Musculoskeletal: Normal range of motion.  Lymphadenopathy:    He has no cervical adenopathy.  Neurological: He is alert and oriented to person, place, and time. He has normal strength and normal reflexes. No cranial nerve deficit or sensory deficit. He displays a negative Romberg sign. GCS eye subscore is 4. GCS verbal subscore is 5. GCS motor subscore is 6.       Normal Gait  Skin: Skin is warm and dry. No rash noted. No cyanosis. Nails show no clubbing.  Psychiatric: He has a normal mood and affect. His speech is normal and behavior is normal.    ED Course  Procedures  MDM Muscle relaxer and pain control for clinical muscle spasm R neck. Rx for same. No clinical infection  or bony abnormality/ injury. neurovascularily intact.      Sunnie Nielsen, MD 01/10/11 [phone]

## 2011-01-10 NOTE — ED Notes (Signed)
Pt complains of neck pain, says he awoke from sleeping on his stomach and felt like someone was twisting his head around.  Denies numbnessor tingling

## 2011-01-10 NOTE — ED Notes (Signed)
Woke with sore neck yesterday morning, denies any known injury

## 2011-01-17 ENCOUNTER — Encounter (HOSPITAL_COMMUNITY): Payer: Self-pay | Admitting: Emergency Medicine

## 2011-01-17 DIAGNOSIS — F17200 Nicotine dependence, unspecified, uncomplicated: Secondary | ICD-10-CM | POA: Insufficient documentation

## 2011-01-17 DIAGNOSIS — H53149 Visual discomfort, unspecified: Secondary | ICD-10-CM | POA: Insufficient documentation

## 2011-01-17 DIAGNOSIS — R51 Headache: Secondary | ICD-10-CM | POA: Insufficient documentation

## 2011-01-17 DIAGNOSIS — M549 Dorsalgia, unspecified: Secondary | ICD-10-CM | POA: Insufficient documentation

## 2011-01-17 DIAGNOSIS — Z87442 Personal history of urinary calculi: Secondary | ICD-10-CM | POA: Insufficient documentation

## 2011-01-17 DIAGNOSIS — M79609 Pain in unspecified limb: Secondary | ICD-10-CM | POA: Insufficient documentation

## 2011-01-17 NOTE — ED Notes (Signed)
Patient c/o headache x 1 hour.  Denies N/V.

## 2011-01-18 ENCOUNTER — Emergency Department (HOSPITAL_COMMUNITY)
Admission: EM | Admit: 2011-01-18 | Discharge: 2011-01-18 | Disposition: A | Discharge status: Home / Self Care | Payer: Self-pay | Attending: Emergency Medicine | Admitting: Emergency Medicine

## 2011-01-18 ENCOUNTER — Encounter (HOSPITAL_COMMUNITY): Payer: Self-pay | Admitting: Emergency Medicine

## 2011-01-18 DIAGNOSIS — R51 Headache: Secondary | ICD-10-CM

## 2011-01-18 MED ORDER — METOCLOPRAMIDE HCL 5 MG/ML IJ SOLN
10.0000 mg | Freq: Once | INTRAMUSCULAR | Status: AC
  Administered 2011-01-18: 10 mg via INTRAMUSCULAR
  Filled 2011-01-18: qty 2

## 2011-01-18 MED ORDER — DIPHENHYDRAMINE HCL 25 MG PO CAPS
25.0000 mg | ORAL_CAPSULE | Freq: Once | ORAL | Status: AC
  Administered 2011-01-18: 25 mg via ORAL
  Filled 2011-01-18: qty 1

## 2011-01-18 MED ORDER — MORPHINE SULFATE 10 MG/ML IJ SOLN
6.0000 mg | Freq: Once | INTRAMUSCULAR | Status: AC
  Administered 2011-01-18: 6 mg via INTRAMUSCULAR
  Filled 2011-01-18: qty 1

## 2011-01-18 NOTE — ED Provider Notes (Addendum)
History     CSN: [id number] Arrival date & time: 01/18/2011 12:20 AM  Chief Complaint  Patient presents with  . Headache   HPI Comments: Patient c/o gradual onset of frontal headache this evening.  States that he has headaches intermittently but usually improve with Tylenol.  States he took two tylenol tablets approx 2 hrs prior to Ed arrival.  He also reports photophobia and sensitivity to sound.  He denies fever, neck stiffness, vomiting, numbness or weakness.  Patient is a 20 y.o. male presenting with headaches. The history is provided by the patient.  Headache  This is a new problem. The current episode started 1 to 2 hours ago. The problem occurs constantly. The problem has not changed since onset.The headache is associated with bright light. The pain is located in the frontal region. The quality of the pain is described as throbbing. The pain is moderate. The pain does not radiate. Pertinent negatives include no anorexia, no fever, no malaise/fatigue, no chest pressure, no near-syncope, no orthopnea, no palpitations, no syncope, no shortness of breath, no nausea and no vomiting. He has tried acetaminophen for the symptoms. The treatment provided no relief.    Past Medical History  Diagnosis Date  . Kidney calculi   . Chronic back pain   . Chronic leg pain     Past Surgical History  Procedure Date  . Kidney stone surgery     History reviewed. No pertinent family history.  History  Substance Use Topics  . Smoking status: Current Everyday Smoker    Types: Cigarettes  . Smokeless tobacco: Not on file  . Alcohol Use: No      Review of Systems  Constitutional: Negative for fever and malaise/fatigue.  HENT: Negative for neck pain and neck stiffness.   Eyes: Positive for photophobia. Negative for pain and visual disturbance.  Respiratory: Negative for shortness of breath.   Cardiovascular: Negative for palpitations, orthopnea, syncope and near-syncope.  Gastrointestinal:  Negative for nausea, vomiting and anorexia.  Musculoskeletal: Negative.   Neurological: Positive for headaches. Negative for dizziness, syncope, facial asymmetry, speech difficulty, weakness and numbness.  Hematological: Negative for adenopathy.  All other systems reviewed and are negative.    Physical Exam  BP 159/101  Pulse 108  Temp(Src) 98.8 F (37.1 C) (Oral)  Resp 18  Ht 5\' 8"  (1.727 m)  Wt 163 lb (73.936 kg)  BMI 24.78 kg/m2  SpO2 99%  Physical Exam  Nursing note and vitals reviewed. Constitutional: He is oriented to person, place, and time. He appears well-developed and well-nourished. No distress.  HENT:  Head: Normocephalic and atraumatic.  Mouth/Throat: Oropharynx is clear and moist.  Eyes: Conjunctivae and EOM are normal. Pupils are equal, round, and reactive to light.  Neck: Normal range of motion. Neck supple. No spinous process tenderness and no muscular tenderness present. Normal range of motion present. No Brudzinski's sign and no Kernig's sign noted. No thyromegaly present.  Cardiovascular: Normal rate, regular rhythm and normal heart sounds.   Pulmonary/Chest: Effort normal and breath sounds normal.  Abdominal: Soft. He exhibits no distension. There is no tenderness. There is no rebound and no guarding.  Musculoskeletal: Normal range of motion.  Lymphadenopathy:    He has no cervical adenopathy.  Neurological: He is alert and oriented to person, place, and time. He has normal strength and normal reflexes. He displays normal reflexes. No cranial nerve deficit or sensory deficit. He exhibits normal muscle tone. Coordination and gait normal.  Skin: Skin is warm and  dry.    ED Course  Procedures  MDM  1:07 AM patient is resting, NAD.  Vitals signs and nursing notes were reviewed.  Pt is non-toxic appearing.  No focal neuro deficits, no meningeal signs.  I have reviewed pt's previous ED charts.      MEDICATIONS GIVEN IN THE ED:   Medications    metoCLOPramide (REGLAN) injection 10 mg (10 mg Intramuscular Given 01/18/11 0118)  diphenhydrAMINE (BENADRYL) capsule 25 mg (25 mg Oral Given 01/18/11 0118)  morphine injection 6 mg (6 mg Intramuscular Given 01/18/11 0118)     The patient appears reasonably screened and/or stabilized for discharge and I doubt any other medical condition or other [facility] requiring further screening, evaluation, or treatment in the ED at this time prior to discharge.   Pt feels improved after observation and/or treatment in ED.     Tammy L. Trisha Mangle, PA Medical screening examination/treatment/procedure(s) were performed by non-physician practitioner and as supervising physician I was immediately available for consultation/collaboration.01/18/11 0143  Nicoletta Dress. Colon Branch, MD 02/01/11 [phone]

## 2011-02-15 LAB — CBC
HCT: 32.3 — ABNORMAL LOW
HCT: 37.9 — ABNORMAL LOW
Hemoglobin: 10.9 — ABNORMAL LOW
Hemoglobin: 12.8 — ABNORMAL LOW
MCHC: 33
MCHC: 33.2
MCHC: 33.6
MCV: 79.6
MCV: 80.2
MCV: 80.2
MCV: 80.4
Platelets: 246
RBC: 4.06 — ABNORMAL LOW
RBC: 4.36
RBC: 4.72
RDW: 14.9
WBC: 12.9 — ABNORMAL HIGH

## 2011-02-15 LAB — COMPREHENSIVE METABOLIC PANEL
AST: 19
AST: 23
Albumin: 3.2 — ABNORMAL LOW
CO2: 26
Calcium: 9.1
Chloride: 105
Creatinine, Ser: 0.94
Creatinine, Ser: 0.94
GFR calc Af Amer: >60
GFR calc Af Amer: >60
GFR calc non Af Amer: >60
GFR calc non Af Amer: >60
Glucose, Bld: 101 — ABNORMAL HIGH
Total Bilirubin: 0.7

## 2011-02-15 LAB — BASIC METABOLIC PANEL
BUN: 1 — ABNORMAL LOW
BUN: 2 — ABNORMAL LOW
CO2: 25
CO2: 27
Calcium: 9.2
Chloride: 107
Chloride: 110
Creatinine, Ser: 0.93
Creatinine, Ser: 1.05
GFR calc Af Amer: >60
GFR calc Af Amer: >60
GFR calc non Af Amer: >60
Glucose, Bld: 82
Glucose, Bld: 90
Potassium: 3.6
Sodium: 140

## 2011-02-15 LAB — DIFFERENTIAL
Basophils Absolute: 0
Basophils Absolute: 0
Basophils Relative: 1
Basophils Relative: 1
Eosinophils Absolute: 0
Eosinophils Absolute: 0.2
Eosinophils Absolute: 0.3
Eosinophils Relative: 0
Eosinophils Relative: 6 — ABNORMAL HIGH
Eosinophils Relative: 6 — ABNORMAL HIGH
Lymphocytes Relative: 27
Lymphocytes Relative: 6 — ABNORMAL LOW
Lymphs Abs: 2
Monocytes Absolute: 0.4
Monocytes Absolute: 0.5
Monocytes Relative: 8
Monocytes Relative: 8
Neutrophils Relative %: 51
Neutrophils Relative %: 64
Neutrophils Relative %: 90 — ABNORMAL HIGH

## 2011-02-15 LAB — URINALYSIS, ROUTINE W REFLEX MICROSCOPIC
Ketones, ur: 15 — AB
Nitrite: NEGATIVE
Protein, ur: NEGATIVE
pH: 6.5

## 2011-02-15 LAB — CULTURE, BLOOD (ROUTINE X 2): Culture: NO GROWTH

## 2011-03-01 LAB — DIFFERENTIAL
Basophils Absolute: 0
Eosinophils Absolute: 0.3
Eosinophils Relative: 5
Lymphs Abs: 2.1

## 2011-03-01 LAB — CBC
HCT: 43.5
MCHC: 34.1
MCV: 84.7
Platelets: 283
RDW: 13.6

## 2011-03-01 LAB — URINALYSIS, ROUTINE W REFLEX MICROSCOPIC
Bilirubin Urine: NEGATIVE
Ketones, ur: NEGATIVE
Nitrite: NEGATIVE
Specific Gravity, Urine: 1.015
Urobilinogen, UA: 0.2

## 2011-03-01 LAB — BASIC METABOLIC PANEL
BUN: 12
Chloride: 105
Glucose, Bld: 94
Potassium: 4.4

## 2011-03-01 LAB — LIPASE, BLOOD: Lipase: 18

## 2011-09-01 IMAGING — CT CT ABD-PELV W/O CM
3 of 4 series · 8 of 46 positions shown, 15 images · non-contrast
Comparison: CT abdomen pelvis 07/13/2008 and abdominal radiograph
07/15/2008

CLINICAL DATA: Right flank pain.  History of renal stones.

CT ABDOMEN AND PELVIS WITHOUT CONTRAST
TECHNIQUE: Multidetector CT imaging of the abdomen and pelvis was
performed following the standard protocol without intravenous
contrast.

[Series 3: lung 5.0 b60f · axial · 0.70mm/px · z∈[-141,-81]mm · 4 of 22 slices shown, 9 images]
[im 5/22  soft-tissue]
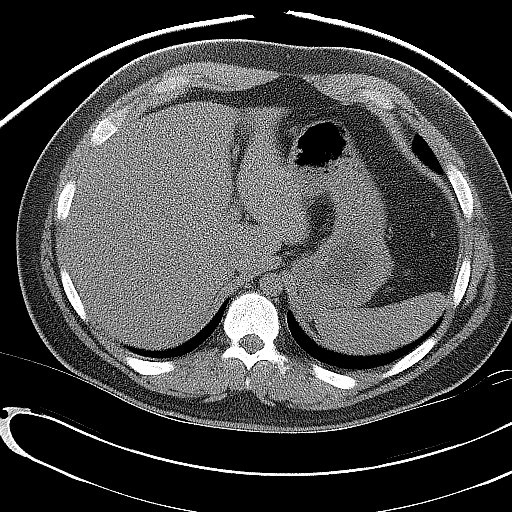
[im 5/22  lung]
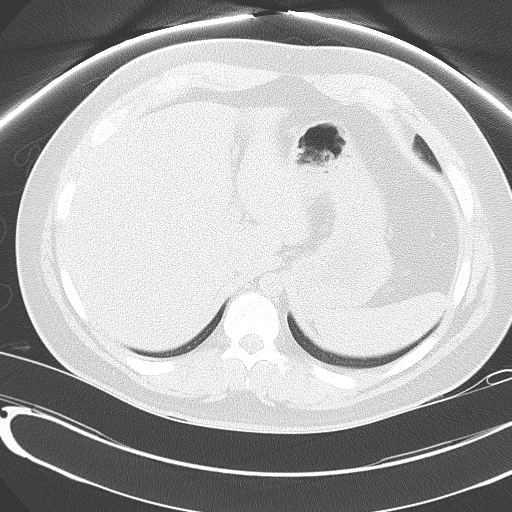
[im 5/22  bone]
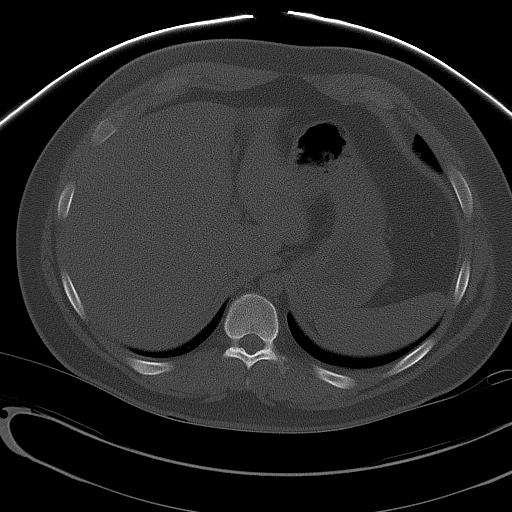
[im 9/22  soft-tissue]
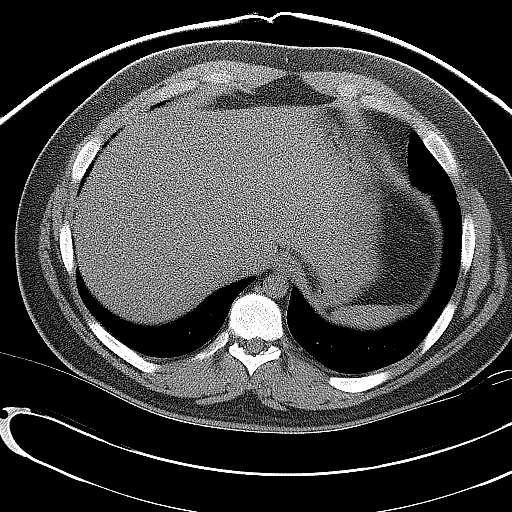
[im 9/22  lung]
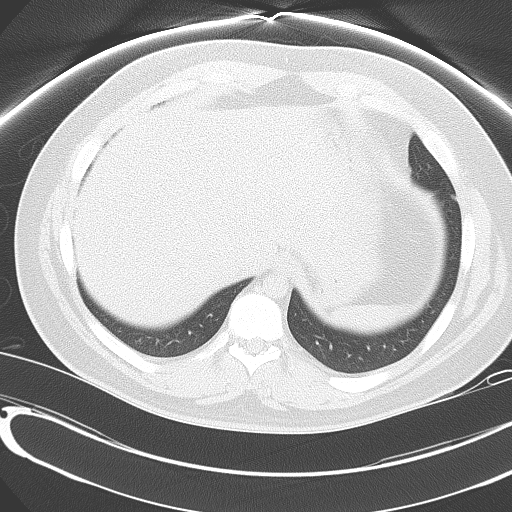
[im 13/22  soft-tissue]
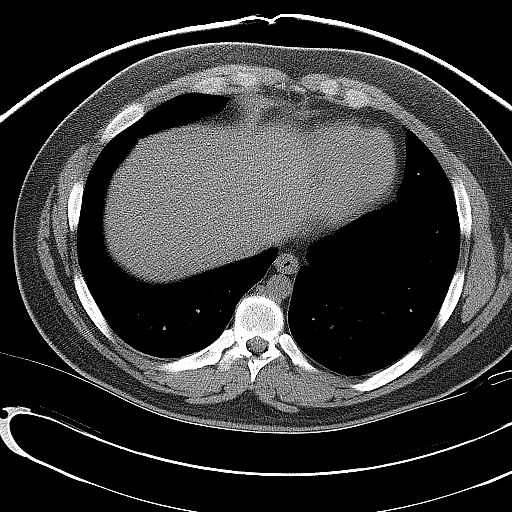
[im 13/22  lung]
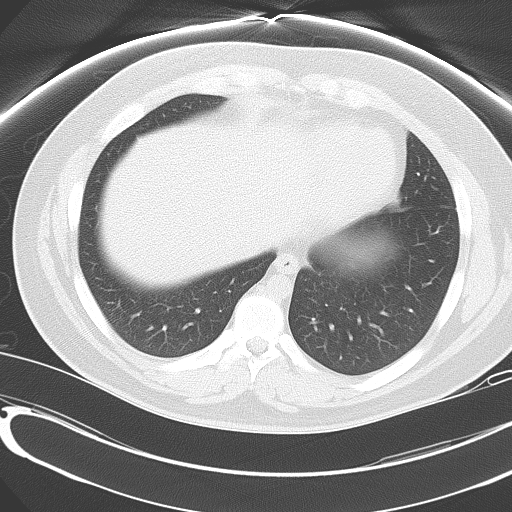
[im 17/22  soft-tissue]
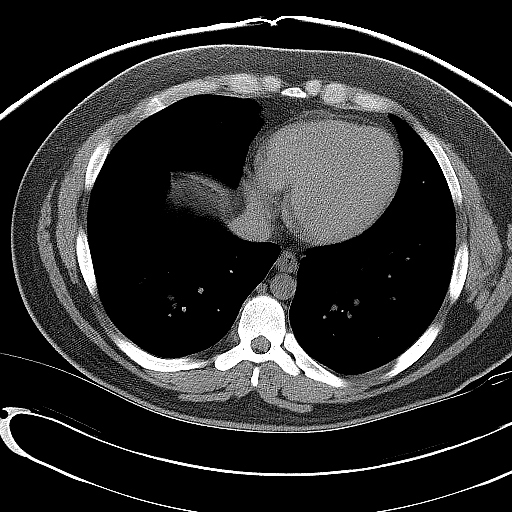
[im 17/22  lung]
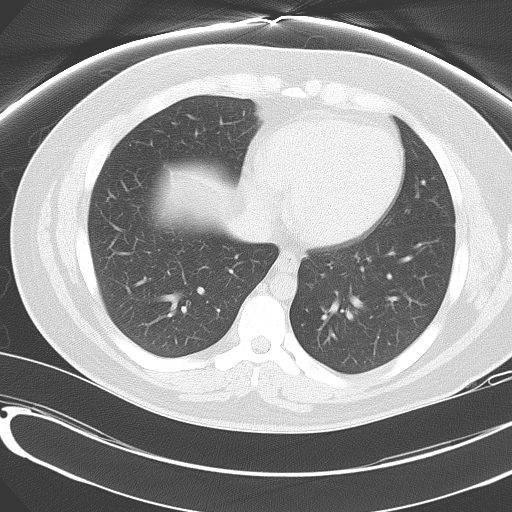

[Series 4: mpr coronal (id) · coronal · 0.81mm/px · 3 of 75 slices shown, 4 images]
[im 25/75  soft-tissue]
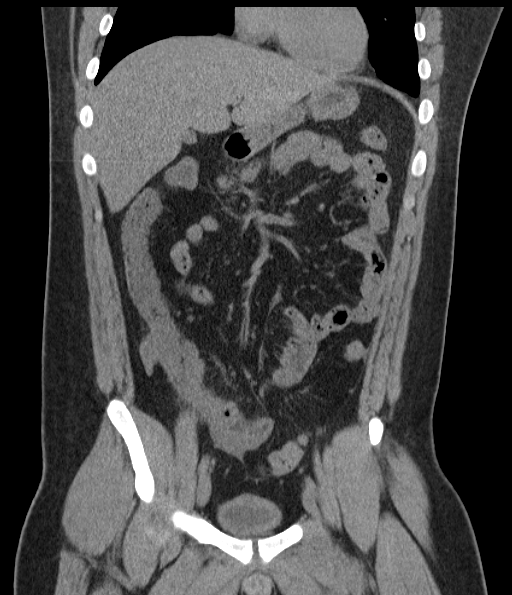
[im 33/75  soft-tissue]
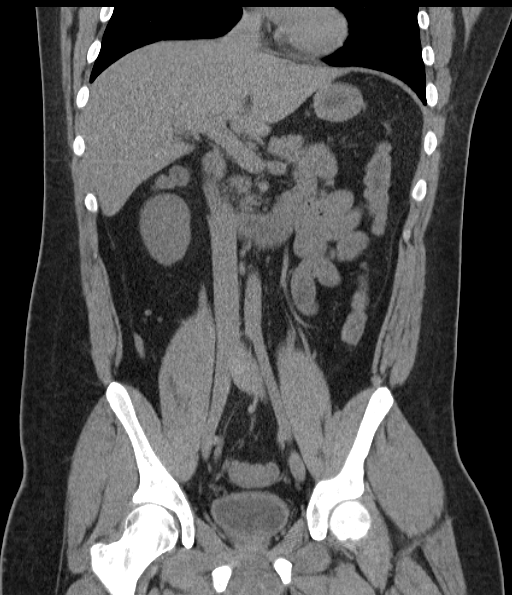
[im 33/75  bone]
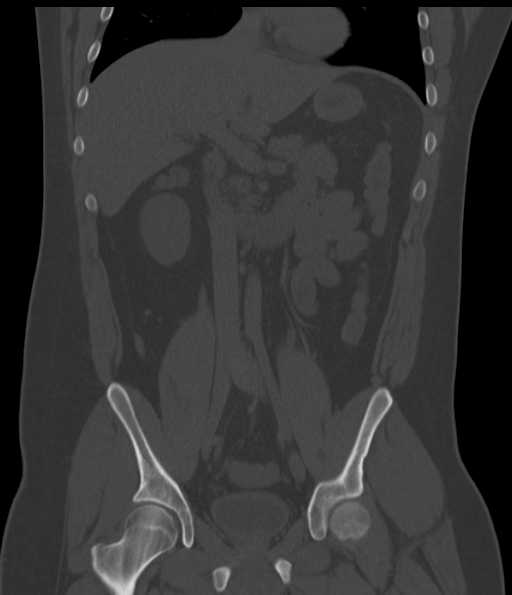
[im 42/75  soft-tissue]
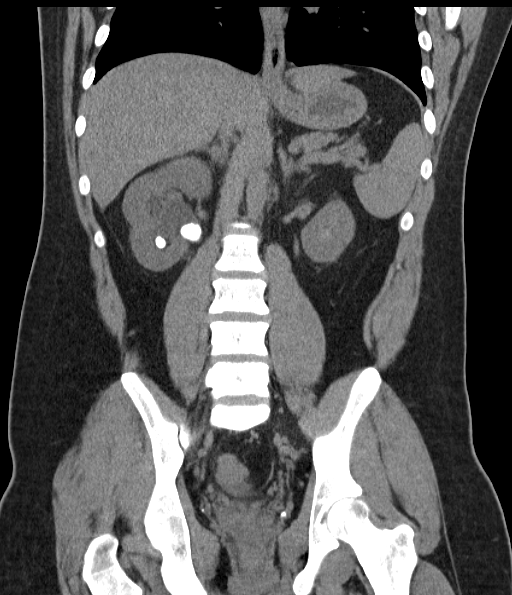

[Series 5: mpr sagittal (id) · sagittal · 0.63mm/px · 1 of 101 slices shown, 2 images]
[im 34/101  soft-tissue]
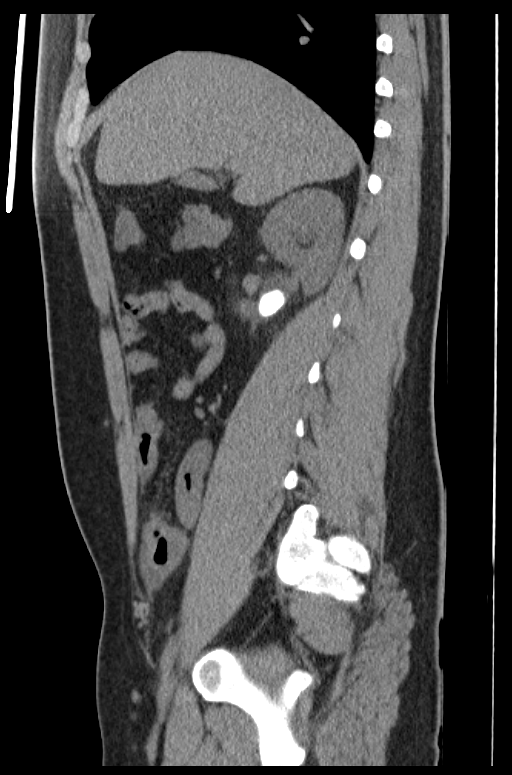
[im 34/101  bone]
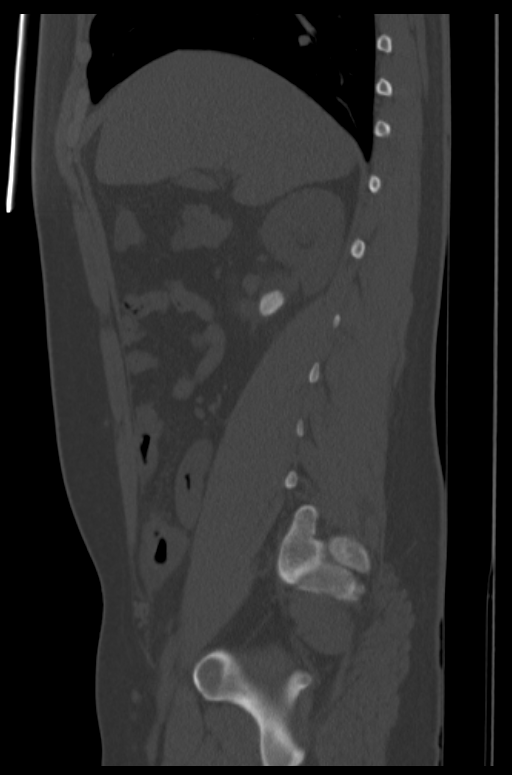

[8 of 46 positions shown; findings below may reference images not displayed]

FINDINGS: Lung bases are clear.

There is bilateral nephrolithiasis.  Stone burden in the right
kidney has increased since prior study.  Currently, there is a
dominant stone in the at the ureteropelvic junction on the right
that measures 2.0 x 1.4 cm is axial dimension and approximately
cm in craniocaudal span the results in moderate right
hydronephrosis.  There are additional stones in the collecting
system of the right kidney, with approximately eight stones
identified.  Aside from the stone in the ureteropelvic junction,
the next largest measures 9 mm greatest diameter.

There are several stones in the left kidney, that appear similar
compared to the prior stone study of 4757.  The largest stone is in
the lower pole and measures 9 x 6 mm.  There is no hydronephrosis.

Both ureters are normal in caliber.

The unenhanced appearance of the liver, gallbladder, this leaning,
adrenal glands, and pancreas is within normal limits.  The ureters
are normal in caliber.  The appendix is normal.

There is some wall thickening of the distal and terminal ileum
(example image #66 of series 2).  There is a small amount of
adjacent free fluid versus fascial thickening inferior of to these
loops. Additionally, there is a small amount of fluid in the left
aspect of the pelvis, versus fascial thickening (image #77). The
appearance of this distal bowel wall thickening and fluid or
thickening is essentially the same, as was seen in February 2008.

The urinary bladder is not very distended at the time imaging
unremarkable.  Normal appearance prostate gland and seminal
vesicles.  The colon is unremarkable.  There is no lymphadenopathy.
Visualized vertebral bodies are normal in appearance.  The
sacroiliac joints have normal appearances.
IMPRESSION: 1.  2.0 x 1.4 x 1.4 cm obstructing stone at the right ureteropelvic
junction, results in moderate right hydronephrosis. To
2.  Bilateral nephrolithiasis as described above.  There is no
obstruction on the left.

3.  Bowel wall thickening and adjacent fluid versus fascial
thickening in the distal and terminal ileum.  There is also a small
amount of fluid or thickening in the left aspect of the pelvis.
These findings are very similar to this CT of 6550 and suggest
either chronic or and/or recurrent small bowel inflammation.  Given
the patient's young age, this is suspicious for inflammatory bowel
disease. Consider follow-up gastroenterology consultation, if the
patient has not previously been evaluated for this finding.

## 2022-01-04 ENCOUNTER — Emergency Department (HOSPITAL_COMMUNITY)
Admission: EM | Admit: 2022-01-04 | Discharge: 2022-01-04 | Disposition: A | Discharge status: Home / Self Care | Payer: Self-pay | Attending: Emergency Medicine | Admitting: Emergency Medicine

## 2022-01-04 ENCOUNTER — Encounter (HOSPITAL_COMMUNITY): Payer: Self-pay

## 2022-01-04 ENCOUNTER — Emergency Department (HOSPITAL_COMMUNITY): Payer: Self-pay

## 2022-01-04 DIAGNOSIS — S39012A Strain of muscle, fascia and tendon of lower back, initial encounter: Secondary | ICD-10-CM | POA: Insufficient documentation

## 2022-01-04 DIAGNOSIS — X500XXA Overexertion from strenuous movement or load, initial encounter: Secondary | ICD-10-CM | POA: Insufficient documentation

## 2022-01-04 MED ORDER — METHOCARBAMOL 500 MG PO TABS
500.0000 mg | ORAL_TABLET | Freq: Once | ORAL | Status: AC
  Administered 2022-01-04: 500 mg via ORAL
  Filled 2022-01-04: qty 1

## 2022-01-04 MED ORDER — METHOCARBAMOL 500 MG PO TABS: 500.0000 mg | ORAL_TABLET | Freq: Three times a day (TID) | ORAL | 0 refills | Status: DC

## 2022-01-04 MED ORDER — OXYCODONE-ACETAMINOPHEN 5-325 MG PO TABS
1.0000 | ORAL_TABLET | Freq: Once | ORAL | Status: AC
  Administered 2022-01-04: 1 via ORAL
  Filled 2022-01-04: qty 1

## 2022-01-04 MED ORDER — HYDROMORPHONE HCL 1 MG/ML IJ SOLN
0.5000 mg | Freq: Once | INTRAMUSCULAR | Status: AC
  Administered 2022-01-04: 0.5 mg via INTRAMUSCULAR

## 2022-01-04 MED ORDER — HYDROMORPHONE HCL 1 MG/ML IJ SOLN
0.5000 mg | Freq: Once | INTRAMUSCULAR | Status: DC
  Filled 2022-01-04: qty 0.5

## 2022-01-04 MED ORDER — OXYCODONE-ACETAMINOPHEN 5-325 MG PO TABS: 1.0000 | ORAL_TABLET | ORAL | 0 refills | Status: DC | PRN

## 2022-01-04 NOTE — ED Triage Notes (Signed)
Pt states he went to go pick up heavy water jug when he felt a pop in his lower back.

## 2022-01-04 NOTE — ED Notes (Signed)
[place], PA and [place], [place] made aware of pts current BP (175/124).

## 2022-01-04 NOTE — ED Provider Notes (Signed)
  Physical Exam  BP (!) 175/128 (BP Location: Left Arm)   Pulse 99   Temp 99.1 F (37.3 C) (Oral)   Resp (!) 22   Ht 5\' 6"  (1.676 m)   Wt 84.5 kg   SpO2 97%   BMI 30.07 kg/m   Physical Exam  Procedures  Procedures  ED Course / MDM    Medical Decision Making Amount and/or Complexity of Data Reviewed Radiology: ordered.  Risk Prescription drug management.   ***

## 2022-01-04 NOTE — ED Provider Notes (Signed)
[facility] EMERGENCY DEPARTMENT Provider Note   CSN: [id number] Arrival date & time: 01/04/22  1625     History  Chief Complaint  Patient presents with   Back Pain    Manuel Riggs is a 31 y.o. male.   Back Pain Associated symptoms: no abdominal pain, no chest pain, no fever, no headaches, no numbness and no weakness         Manuel Riggs is a 31 y.o. male who presents to the Emergency Department complaining of sudden onset of left-sided low back pain.  He attempted to pick up a bucket of water and felt a "pop" in his lower back.  He states that he suddenly went dizzy and lost his vision for a few seconds.  Symptoms have since resolved but continues to have pain of his lower back.  Pain radiates to his upper left thigh.  Pain is worse with attempting to stand or walk, improves while laying supine.  He denies any numbness or weakness of his lower extremities, abdominal pain, urine or bowel changes.      Home Medications Prior to Admission medications   Medication Sig Start Date End Date Taking? Authorizing Provider  tiopronin (THIOLA) 100 MG tablet Take 100 mg by mouth 3 (three) times daily.      [provider]      Allergies    Aspirin    Review of Systems   Review of Systems  Constitutional:  Negative for appetite change, chills and fever.  Eyes:  Positive for visual disturbance.  Respiratory:  Negative for shortness of breath.   Cardiovascular:  Negative for chest pain.  Gastrointestinal:  Negative for abdominal pain, diarrhea, nausea and vomiting.  Musculoskeletal:  Positive for back pain. Negative for neck pain.  Neurological:  Positive for dizziness. Negative for weakness, light-headedness, numbness and headaches.    Physical Exam Updated Vital Signs BP (!) 182/120 (BP Location: Left Arm)   Pulse 99   Temp 99.1 F (37.3 C) (Oral)   Resp (!) 22   Ht 5\' 6"  (1.676 m)   Wt 84.5 kg   SpO2 97%   BMI 30.07 kg/m  Physical Exam Vitals and nursing  note reviewed.  Constitutional:      General: He is not in acute distress.    Appearance: Normal appearance. He is not ill-appearing or toxic-appearing.  HENT:     Mouth/Throat:     Mouth: Mucous membranes are moist.  Cardiovascular:     Rate and Rhythm: Normal rate and regular rhythm.     Pulses: Normal pulses.  Pulmonary:     Effort: Pulmonary effort is normal.     Breath sounds: Normal breath sounds.  Abdominal:     Palpations: Abdomen is soft.     Tenderness: There is no abdominal tenderness.  Musculoskeletal:        General: Tenderness and signs of injury present.     Lumbar back: Spasms and tenderness present. No swelling. Decreased range of motion. Positive left straight leg raise test.       Back:     Right lower leg: No edema.     Left lower leg: No edema.     Comments: Tender to palpation of the left lower lumbar paraspinal muscles.  No bony step-offs  Skin:    General: Skin is warm.     Capillary Refill: Capillary refill takes less than 2 seconds.  Neurological:     General: No focal deficit present.  Mental Status: He is alert.     Sensory: No sensory deficit.     Motor: No weakness.     ED Results / Procedures / Treatments   Labs (all labs ordered are listed, but only abnormal results are displayed) Labs Reviewed - No data to display  EKG None  Radiology DG Lumbar Spine Complete  Result Date: 01/04/2022 CLINICAL DATA:  Back injury. EXAM: LUMBAR SPINE - COMPLETE 4+ VIEW COMPARISON:  None Available. FINDINGS: There are 5 non-rib-bearing lumbar vertebra. The alignment is maintained. Vertebral body heights are normal. There is no listhesis. The posterior elements are intact. Disc spaces are preserved. No fracture, focal bone abnormality or pars defects. Sacroiliac joints are symmetric and normal. IMPRESSION: Negative radiographs of the lumbar spine. Electronically Signed   By: Narda Rutherford M.D.   On: 01/04/2022 18:43    Procedures Procedures     Medications Ordered in ED Medications  HYDROmorphone (DILAUDID) injection 0.5 mg (has no administration in time range)  oxyCODONE-acetaminophen (PERCOCET/ROXICET) 5-325 MG per tablet 1 tablet (1 tablet Oral Given 01/04/22 1822)  methocarbamol (ROBAXIN) tablet 500 mg (500 mg Oral Given 01/04/22 1822)    ED Course/ Medical Decision Making/ A&P                           Medical Decision Making Patient here for evaluation of left-sided low back pain after picking up a heavy bucket of water.  Felt a pop in his back.  He has pain with movement.  Pain radiates to left upper thigh only.  No extremity weakness.  On exam, patient appears uncomfortable he is grimacing on exam and holding his left lower back.  Able to bear weight without difficulty.  Ambulates with slow gait.  No ataxia.  No focal neurodeficits on exam.  Differential diagnosis would include but not limited to musculoskeletal injury, cauda equina, disc injury.  I suspect this is musculoskeletal, no red flags on exam to suggest cauda equina.  Will obtain x-ray and address his pain.  He is hypertensive here without history of hypertension, I suspect this is secondary to his level of pain.  Will medicate and reassess  Amount and/or Complexity of Data Reviewed Radiology: ordered.    Details: X-ray of the L-spine negative Discussion of management or test interpretation with external provider(s): Discussed x-ray findings with patient.  Likely musculoskeletal injury.  He is agreeable to symptomatic treatment.  I anticipate discharge home, prescription sent for pain medication and muscle relaxer.  Discussed with Parke Poisson, PA-C who agrees to dispo pt oncef BP improves.    Risk Prescription drug management.           Final Clinical Impression(s) / ED Diagnoses Final diagnoses:  Strain of lumbar region, initial encounter    Rx / DC Orders ED Discharge Orders     None         Rosey Bath 01/04/22  1929    Gloris Manchester, MD 01/05/22 [id number]

## 2022-01-04 NOTE — ED Notes (Signed)
ED Provider at bedside. 

## 2022-01-04 NOTE — Discharge Instructions (Addendum)
Alternate ice and heat to your lower back.  Avoid twisting, bending or heavy lifting for at least 1 week.  Take the medication as directed the prescribed medications may cause drowsiness.  Do not operate machinery or drive while taking these medications.  You may also try over-the-counter 4% lidocaine patches applied to the affected area.  Use as directed.  Follow-up with your primary care provider or with the orthopedic provider listed in 1 week if your symptoms or not improving.  Additionally please follow-up with the [facility] to have your blood pressure rechecked.

## 2022-11-07 ENCOUNTER — Inpatient Hospital Stay (HOSPITAL_COMMUNITY)
Admission: EM | Admit: 2022-11-07 | Discharge: 2022-11-11 | DRG: 683 | Disposition: A | Discharge status: Home / Self Care | Payer: 59 | Attending: Family Medicine | Admitting: Family Medicine

## 2022-11-07 ENCOUNTER — Other Ambulatory Visit: Payer: Self-pay

## 2022-11-07 ENCOUNTER — Emergency Department (HOSPITAL_COMMUNITY): Payer: 59

## 2022-11-07 ENCOUNTER — Encounter (HOSPITAL_COMMUNITY): Payer: Self-pay | Admitting: Emergency Medicine

## 2022-11-07 DIAGNOSIS — N1832 Chronic kidney disease, stage 3b: Secondary | ICD-10-CM | POA: Diagnosis present

## 2022-11-07 DIAGNOSIS — F17200 Nicotine dependence, unspecified, uncomplicated: Secondary | ICD-10-CM | POA: Insufficient documentation

## 2022-11-07 DIAGNOSIS — I160 Hypertensive urgency: Secondary | ICD-10-CM | POA: Diagnosis present

## 2022-11-07 DIAGNOSIS — N200 Calculus of kidney: Secondary | ICD-10-CM | POA: Diagnosis present

## 2022-11-07 DIAGNOSIS — I10 Essential (primary) hypertension: Secondary | ICD-10-CM | POA: Diagnosis not present

## 2022-11-07 DIAGNOSIS — I129 Hypertensive chronic kidney disease with stage 1 through stage 4 chronic kidney disease, or unspecified chronic kidney disease: Secondary | ICD-10-CM | POA: Diagnosis present

## 2022-11-07 DIAGNOSIS — N179 Acute kidney failure, unspecified: Principal | ICD-10-CM | POA: Diagnosis present

## 2022-11-07 DIAGNOSIS — F1290 Cannabis use, unspecified, uncomplicated: Secondary | ICD-10-CM | POA: Diagnosis present

## 2022-11-07 DIAGNOSIS — F17210 Nicotine dependence, cigarettes, uncomplicated: Secondary | ICD-10-CM | POA: Diagnosis present

## 2022-11-07 DIAGNOSIS — E872 Acidosis, unspecified: Secondary | ICD-10-CM | POA: Clinically undetermined

## 2022-11-07 DIAGNOSIS — I169 Hypertensive crisis, unspecified: Secondary | ICD-10-CM | POA: Insufficient documentation

## 2022-11-07 DIAGNOSIS — R059 Cough, unspecified: Secondary | ICD-10-CM | POA: Diagnosis not present

## 2022-11-07 MED ORDER — ONDANSETRON HCL 4 MG/2ML IJ SOLN
4.0000 mg | Freq: Once | INTRAMUSCULAR | Status: AC
  Administered 2022-11-07: 4 mg via INTRAVENOUS
  Filled 2022-11-07: qty 2

## 2022-11-07 MED ORDER — SODIUM CHLORIDE 0.9 % IV BOLUS
1000.0000 mL | Freq: Once | INTRAVENOUS | Status: AC
  Administered 2022-11-07: 1000 mL via INTRAVENOUS

## 2022-11-07 NOTE — ED Notes (Signed)
ED Provider at bedside. 

## 2022-11-07 NOTE — ED Provider Notes (Signed)
[place] EMERGENCY DEPARTMENT AT [facility] Provider Note   CSN: [id number] Arrival date & time: 11/07/22  2255     History {Add pertinent medical, surgical, social history, OB history to HPI:1} Chief Complaint  Patient presents with   Nausea    GIOVONNI POIRIER is a 32 y.o. male.  The history is provided by the patient.   Patient with previous history of nephrolithiasis presents with multiple complaints Patient reports for the past several months he will have frequent episodes of waking up in the morning with generalized nausea and fatigue; he also reports decreased appetite and has lost weight but he is unclear how much he is actually lost in the past several months. Patient reports yesterday he felt well without any issues.  He does admit to smoking marijuana but denies any other drug or alcohol use.  He reports he woke up earlier this morning and had significant episode of generalized fatigue, nausea and abdominal pain.  He reports generalized weakness.  He reports nausea and 1 episode of nonbloody emesis.  He also reports 1 episode of nonbloody diarrhea.  No sick contacts.  No recent travel or camping.  No recent tick bites or new rashes. He reports cough  He denies any daily medications Past Medical History:  Diagnosis Date   Chronic back pain    Chronic leg pain    Kidney calculi     Home Medications Prior to Admission medications   Not on File      Allergies    Aspirin    Review of Systems   Review of Systems  Constitutional:  Positive for appetite change, fatigue and unexpected weight change. Negative for fever.  Respiratory:  Positive for cough. Negative for shortness of breath.   Cardiovascular:  Negative for chest pain.  Gastrointestinal:  Positive for abdominal pain, diarrhea, nausea and vomiting. Negative for blood in stool.  Skin:  Negative for rash.  Neurological:  Positive for headaches.    Physical Exam Updated Vital Signs BP (!) 188/121  (BP Location: Left Arm)   Pulse 94   Temp 98.2 F (36.8 C) (Oral)   Resp 16   Ht 1.676 m (5\' 6" )   Wt 74.6 kg   SpO2 98%   BMI 26.53 kg/m  Physical Exam CONSTITUTIONAL: Ill-appearing HEAD: Normocephalic/atraumatic EYES: EOMI/PERRL, no icterus ENMT: Mucous membranes dry, uvula midline with no erythema or exudates NECK: supple no meningeal signs SPINE/BACK:entire spine nontender CV: S1/S2 noted, no murmurs/rubs/gallops noted LUNGS: Lungs are clear to auscultation bilaterally, no apparent distress ABDOMEN: soft, nontender, no rebound or guarding, bowel sounds noted throughout abdomen GU:no cva tenderness NEURO: Pt is awake/alert/appropriate, moves all extremitiesx4.  No facial droop.  No arm or leg drift. EXTREMITIES: pulses normal/equal, full ROM, no deformities SKIN: warm, color normal, no petechiae are noted  ED Results / Procedures / Treatments   Labs (all labs ordered are listed, but only abnormal results are displayed) Labs Reviewed  LIPASE, BLOOD  COMPREHENSIVE METABOLIC PANEL  CBC  URINALYSIS, ROUTINE W REFLEX MICROSCOPIC  LACTIC ACID, PLASMA  LACTIC ACID, PLASMA  RAPID URINE DRUG SCREEN, HOSP PERFORMED  TROPONIN I (HIGH SENSITIVITY)    EKG EKG Interpretation  Date/Time:  Sunday November 07 2022 23:07:47 EDT Ventricular Rate:  88 PR Interval:  107 QRS Duration: 95 QT Interval:  347 QTC Calculation: 420 R Axis:   74 Text Interpretation: Sinus rhythm Short PR interval Confirmed by Zadie Rhine ([id number]) on 11/07/2022 11:11:22 PM  Radiology No results  found.  Procedures Procedures  {Document cardiac monitor, telemetry assessment procedure when appropriate:1}  Medications Ordered in ED Medications  sodium chloride 0.9 % bolus 1,000 mL (has no administration in time range)  ondansetron (ZOFRAN) injection 4 mg (has no administration in time range)    ED Course/ Medical Decision Making/ A&P   {   Click here for ABCD2, HEART and other calculatorsREFRESH  Note before signing :1}                          Medical Decision Making Amount and/or Complexity of Data Reviewed Labs: ordered. Radiology: ordered. ECG/medicine tests: ordered.  Risk Prescription drug management.   This patient presents to the ED for concern of weakness & nausea, this involves an extensive number of treatment options, and is a complaint that carries with it a high risk of complications and morbidity.  The differential diagnosis includes but is not limited to acute coronary syndrome, renal failure, urinary tract infection, electrolyte disturbance, pneumonia, drug intoxication    Social Determinants of Health: Patient's impaired access to primary care and tobacco use   increases the complexity of managing their presentation  Additional history obtained: Additional history obtained from family Records reviewed Care Everywhere/External Records  Lab Tests: I Ordered, and personally interpreted labs.  The pertinent results include:  ***  Imaging Studies ordered: I ordered imaging studies including X-ray chest   I independently visualized and interpreted imaging which showed *** I agree with the radiologist interpretation  Cardiac Monitoring: The patient was maintained on a cardiac monitor.  I personally viewed and interpreted the cardiac monitor which showed an underlying rhythm of:  sinus rhythm  Medicines ordered and prescription drug management: I ordered medication including zofran  for nausea  Reevaluation of the patient after these medicines showed that the patient    {resolved/improved/worsened:23923::"improved"}  Test Considered: Patient is low risk / negative by ***, therefore do not feel that *** is indicated.  Critical Interventions:  ***  Consultations Obtained: I requested consultation with the {consultation:26851}, and discussed  findings as well as pertinent plan - they recommend: ***  Reevaluation: After the interventions noted above, I  reevaluated the patient and found that they have :{resolved/improved/worsened:23923::"improved"}  Complexity of problems addressed: Patient's presentation is most consistent with  {ZOXW:96045}  Disposition: After consideration of the diagnostic results and the patient's response to treatment,  I feel that the patent would benefit from {disposition:26850}.     {Document critical care time when appropriate:1} {Document review of labs and clinical decision tools ie heart score, Chads2Vasc2 etc:1}  {Document your independent review of radiology images, and any outside records:1} {Document your discussion with family members, caretakers, and with consultants:1} {Document social determinants of health affecting pt's care:1} {Document your decision making why or why not admission, treatments were needed:1} Final Clinical Impression(s) / ED Diagnoses Final diagnoses:  None    Rx / DC Orders ED Discharge Orders     None

## 2022-11-07 NOTE — ED Triage Notes (Signed)
Pt states he has been nauseated since 0900 this morning. States vomited once this am but that he "don't feel good".

## 2022-11-08 ENCOUNTER — Encounter (HOSPITAL_COMMUNITY): Payer: Self-pay

## 2022-11-08 ENCOUNTER — Inpatient Hospital Stay (HOSPITAL_COMMUNITY): Payer: 59

## 2022-11-08 DIAGNOSIS — R0781 Pleurodynia: Secondary | ICD-10-CM | POA: Diagnosis not present

## 2022-11-08 DIAGNOSIS — D649 Anemia, unspecified: Secondary | ICD-10-CM | POA: Diagnosis not present

## 2022-11-08 DIAGNOSIS — N200 Calculus of kidney: Secondary | ICD-10-CM | POA: Diagnosis not present

## 2022-11-08 DIAGNOSIS — Z743 Need for continuous supervision: Secondary | ICD-10-CM | POA: Diagnosis not present

## 2022-11-08 DIAGNOSIS — F17200 Nicotine dependence, unspecified, uncomplicated: Secondary | ICD-10-CM | POA: Diagnosis not present

## 2022-11-08 DIAGNOSIS — R110 Nausea: Secondary | ICD-10-CM | POA: Diagnosis not present

## 2022-11-08 DIAGNOSIS — I169 Hypertensive crisis, unspecified: Secondary | ICD-10-CM | POA: Diagnosis not present

## 2022-11-08 DIAGNOSIS — I129 Hypertensive chronic kidney disease with stage 1 through stage 4 chronic kidney disease, or unspecified chronic kidney disease: Secondary | ICD-10-CM | POA: Diagnosis not present

## 2022-11-08 DIAGNOSIS — N179 Acute kidney failure, unspecified: Secondary | ICD-10-CM | POA: Diagnosis not present

## 2022-11-08 DIAGNOSIS — R109 Unspecified abdominal pain: Secondary | ICD-10-CM | POA: Diagnosis not present

## 2022-11-08 DIAGNOSIS — R000 Tachycardia, unspecified: Secondary | ICD-10-CM | POA: Diagnosis not present

## 2022-11-08 DIAGNOSIS — R0789 Other chest pain: Secondary | ICD-10-CM | POA: Diagnosis not present

## 2022-11-08 DIAGNOSIS — F1290 Cannabis use, unspecified, uncomplicated: Secondary | ICD-10-CM | POA: Diagnosis not present

## 2022-11-08 DIAGNOSIS — R0602 Shortness of breath: Secondary | ICD-10-CM | POA: Diagnosis not present

## 2022-11-08 DIAGNOSIS — R1011 Right upper quadrant pain: Secondary | ICD-10-CM | POA: Diagnosis not present

## 2022-11-08 DIAGNOSIS — N281 Cyst of kidney, acquired: Secondary | ICD-10-CM | POA: Diagnosis not present

## 2022-11-08 DIAGNOSIS — I10 Essential (primary) hypertension: Secondary | ICD-10-CM | POA: Diagnosis not present

## 2022-11-08 DIAGNOSIS — F17210 Nicotine dependence, cigarettes, uncomplicated: Secondary | ICD-10-CM | POA: Diagnosis not present

## 2022-11-08 DIAGNOSIS — R079 Chest pain, unspecified: Secondary | ICD-10-CM | POA: Diagnosis not present

## 2022-11-08 DIAGNOSIS — N1832 Chronic kidney disease, stage 3b: Secondary | ICD-10-CM | POA: Diagnosis not present

## 2022-11-08 DIAGNOSIS — E872 Acidosis, unspecified: Secondary | ICD-10-CM | POA: Diagnosis not present

## 2022-11-08 DIAGNOSIS — I160 Hypertensive urgency: Secondary | ICD-10-CM | POA: Diagnosis not present

## 2022-11-08 LAB — URINALYSIS, ROUTINE W REFLEX MICROSCOPIC
Bacteria, UA: NONE SEEN
Bilirubin Urine: NEGATIVE
Glucose, UA: NEGATIVE mg/dL
Ketones, ur: 5 mg/dL — AB
Leukocytes,Ua: NEGATIVE
Nitrite: NEGATIVE
Protein, ur: >=300 mg/dL — AB
Specific Gravity, Urine: 1.011 (ref 1.005–1.030)
pH: 6 (ref 5.0–8.0)

## 2022-11-08 LAB — CBC
HCT: 42.1 % (ref 39.0–52.0)
Hemoglobin: 13.9 g/dL (ref 13.0–17.0)
MCH: 29.5 pg (ref 26.0–34.0)
MCHC: 33 g/dL (ref 30.0–36.0)
MCV: 89.4 fL (ref 80.0–100.0)
Platelets: 215 10*3/uL (ref 150–400)
RBC: 4.71 MIL/uL (ref 4.22–5.81)
RDW: 13.6 % (ref 11.5–15.5)
WBC: 8.3 10*3/uL (ref 4.0–10.5)
nRBC: 0 % (ref 0.0–0.2)

## 2022-11-08 LAB — COMPREHENSIVE METABOLIC PANEL
ALT: 16 U/L (ref 0–44)
ALT: 17 U/L (ref 0–44)
AST: 14 U/L — ABNORMAL LOW (ref 15–41)
AST: 16 U/L (ref 15–41)
Albumin: 3.6 g/dL (ref 3.5–5.0)
Albumin: 3.9 g/dL (ref 3.5–5.0)
Alkaline Phosphatase: 108 U/L (ref 38–126)
Alkaline Phosphatase: 119 U/L (ref 38–126)
Anion gap: 10 (ref 5–15)
Anion gap: 9 (ref 5–15)
BUN: 28 mg/dL — ABNORMAL HIGH (ref 6–20)
BUN: 29 mg/dL — ABNORMAL HIGH (ref 6–20)
CO2: 18 mmol/L — ABNORMAL LOW (ref 22–32)
CO2: 20 mmol/L — ABNORMAL LOW (ref 22–32)
Calcium: 8.4 mg/dL — ABNORMAL LOW (ref 8.9–10.3)
Calcium: 9 mg/dL (ref 8.9–10.3)
Chloride: 106 mmol/L (ref 98–111)
Chloride: 110 mmol/L (ref 98–111)
Creatinine, Ser: 3.82 mg/dL — ABNORMAL HIGH (ref 0.61–1.24)
Creatinine, Ser: 4.2 mg/dL — ABNORMAL HIGH (ref 0.61–1.24)
GFR, Estimated: 18 mL/min — ABNORMAL LOW (ref >60)
GFR, Estimated: 20 mL/min — ABNORMAL LOW (ref >60)
Glucose, Bld: 100 mg/dL — ABNORMAL HIGH (ref 70–99)
Glucose, Bld: 88 mg/dL (ref 70–99)
Potassium: 4.4 mmol/L (ref 3.5–5.1)
Potassium: 4.5 mmol/L (ref 3.5–5.1)
Sodium: 136 mmol/L (ref 135–145)
Sodium: 137 mmol/L (ref 135–145)
Total Bilirubin: 0.9 mg/dL (ref 0.3–1.2)
Total Bilirubin: 1 mg/dL (ref 0.3–1.2)
Total Protein: 6.7 g/dL (ref 6.5–8.1)
Total Protein: 7.3 g/dL (ref 6.5–8.1)

## 2022-11-08 LAB — MAGNESIUM: Magnesium: 1.8 mg/dL (ref 1.7–2.4)

## 2022-11-08 LAB — LIPASE, BLOOD: Lipase: 85 U/L — ABNORMAL HIGH (ref 11–51)

## 2022-11-08 LAB — CBC WITH DIFFERENTIAL/PLATELET
Abs Immature Granulocytes: 0.01 10*3/uL (ref 0.00–0.07)
Basophils Absolute: 0 10*3/uL (ref 0.0–0.1)
Basophils Relative: 1 %
Eosinophils Absolute: 0.1 10*3/uL (ref 0.0–0.5)
Eosinophils Relative: 1 %
HCT: 39.5 % (ref 39.0–52.0)
Hemoglobin: 13 g/dL (ref 13.0–17.0)
Immature Granulocytes: 0 %
Lymphocytes Relative: 10 %
Lymphs Abs: 0.6 10*3/uL — ABNORMAL LOW (ref 0.7–4.0)
MCH: 29.6 pg (ref 26.0–34.0)
MCHC: 32.9 g/dL (ref 30.0–36.0)
MCV: 90 fL (ref 80.0–100.0)
Monocytes Absolute: 0.6 10*3/uL (ref 0.1–1.0)
Monocytes Relative: 10 %
Neutro Abs: 5.2 10*3/uL (ref 1.7–7.7)
Neutrophils Relative %: 78 %
Platelets: 183 10*3/uL (ref 150–400)
RBC: 4.39 MIL/uL (ref 4.22–5.81)
RDW: 13.5 % (ref 11.5–15.5)
WBC: 6.5 10*3/uL (ref 4.0–10.5)
nRBC: 0 % (ref 0.0–0.2)

## 2022-11-08 LAB — LACTIC ACID, PLASMA: Lactic Acid, Venous: 0.9 mmol/L (ref 0.5–1.9)

## 2022-11-08 LAB — RAPID URINE DRUG SCREEN, HOSP PERFORMED
Amphetamines: NOT DETECTED
Barbiturates: NOT DETECTED
Benzodiazepines: NOT DETECTED
Cocaine: NOT DETECTED
Opiates: NOT DETECTED
Tetrahydrocannabinol: POSITIVE — AB

## 2022-11-08 LAB — HIV ANTIBODY (ROUTINE TESTING W REFLEX): HIV Screen 4th Generation wRfx: NONREACTIVE

## 2022-11-08 LAB — TROPONIN I (HIGH SENSITIVITY): Troponin I (High Sensitivity): 15 ng/L (ref <18)

## 2022-11-08 LAB — SEDIMENTATION RATE: Sed Rate: 13 mm/hr (ref 0–16)

## 2022-11-08 MED ORDER — HYDROMORPHONE HCL 1 MG/ML IJ SOLN: 1.0000 mg | INTRAMUSCULAR | Status: DC | PRN

## 2022-11-08 MED ORDER — HYDROMORPHONE HCL 1 MG/ML IJ SOLN
1.0000 mg | Freq: Four times a day (QID) | INTRAMUSCULAR | Status: DC | PRN
  Administered 2022-11-08 – 2022-11-11 (×10): 1 mg via INTRAVENOUS
  Filled 2022-11-08 (×10): qty 1

## 2022-11-08 MED ORDER — AMLODIPINE BESYLATE 5 MG PO TABS
10.0000 mg | ORAL_TABLET | Freq: Every day | ORAL | Status: DC
  Administered 2022-11-08 – 2022-11-11 (×4): 10 mg via ORAL
  Filled 2022-11-08 (×4): qty 2

## 2022-11-08 MED ORDER — SODIUM CHLORIDE 0.9 % IV SOLN: INTRAVENOUS | Status: AC

## 2022-11-08 MED ORDER — ONDANSETRON HCL 4 MG PO TABS
4.0000 mg | ORAL_TABLET | Freq: Four times a day (QID) | ORAL | Status: DC | PRN
  Administered 2022-11-08 – 2022-11-09 (×2): 4 mg via ORAL
  Filled 2022-11-08 (×2): qty 1

## 2022-11-08 MED ORDER — ONDANSETRON HCL 4 MG/2ML IJ SOLN
4.0000 mg | Freq: Four times a day (QID) | INTRAMUSCULAR | Status: DC | PRN
  Administered 2022-11-08 – 2022-11-11 (×4): 4 mg via INTRAVENOUS
  Filled 2022-11-08 (×4): qty 2

## 2022-11-08 MED ORDER — SODIUM CHLORIDE 0.9 % IV BOLUS
1000.0000 mL | Freq: Once | INTRAVENOUS | Status: AC
  Administered 2022-11-08: 1000 mL via INTRAVENOUS

## 2022-11-08 MED ORDER — HYDRALAZINE HCL 20 MG/ML IJ SOLN
10.0000 mg | Freq: Once | INTRAMUSCULAR | Status: AC
  Administered 2022-11-08: 10 mg via INTRAVENOUS
  Filled 2022-11-08: qty 1

## 2022-11-08 MED ORDER — HYDRALAZINE HCL 25 MG PO TABS
25.0000 mg | ORAL_TABLET | Freq: Three times a day (TID) | ORAL | Status: DC
  Administered 2022-11-08 (×3): 25 mg via ORAL
  Filled 2022-11-08 (×3): qty 1

## 2022-11-08 MED ORDER — HYDRALAZINE HCL 20 MG/ML IJ SOLN
10.0000 mg | INTRAMUSCULAR | Status: DC | PRN
  Administered 2022-11-08: 10 mg via INTRAVENOUS
  Filled 2022-11-08: qty 1

## 2022-11-08 MED ORDER — HEPARIN SODIUM (PORCINE) 5000 UNIT/ML IJ SOLN
5000.0000 [IU] | Freq: Three times a day (TID) | INTRAMUSCULAR | Status: DC
  Administered 2022-11-08 – 2022-11-11 (×10): 5000 [IU] via SUBCUTANEOUS
  Filled 2022-11-08 (×10): qty 1

## 2022-11-08 MED ORDER — OXYCODONE HCL 5 MG PO TABS
5.0000 mg | ORAL_TABLET | ORAL | Status: DC | PRN
  Administered 2022-11-08: 5 mg via ORAL
  Filled 2022-11-08: qty 1

## 2022-11-08 MED ORDER — ACETAMINOPHEN 650 MG RE SUPP: 650.0000 mg | Freq: Four times a day (QID) | RECTAL | Status: DC | PRN

## 2022-11-08 MED ORDER — ACETAMINOPHEN 325 MG PO TABS
650.0000 mg | ORAL_TABLET | Freq: Four times a day (QID) | ORAL | Status: DC | PRN
  Administered 2022-11-08 – 2022-11-09 (×2): 650 mg via ORAL
  Filled 2022-11-08 (×2): qty 2

## 2022-11-08 MED ORDER — SODIUM CHLORIDE 0.9 % IV SOLN
12.5000 mg | Freq: Four times a day (QID) | INTRAVENOUS | Status: DC | PRN
  Administered 2022-11-08 – 2022-11-10 (×3): 12.5 mg via INTRAVENOUS
  Filled 2022-11-08 (×2): qty 0.5

## 2022-11-08 NOTE — Progress Notes (Signed)
PROGRESS NOTE    Patient: Manuel Riggs                            PCP: Patient, No Pcp Per                    DOB: 12-04-1990            DOA: 11/07/2022 ZOX:[id number]             DOS: 11/08/2022, 11:42 AM   LOS: 0 days   Date of Service: The patient was seen and examined on 11/08/2022  Subjective:   The patient was seen and examined this morning. Blood pressure improving, so is his creatinine Patient denies of history of any substance abuse Currently denies having headaches visual changes  Brief Narrative:    Manuel Riggs is a 32 y.o. male with medical history significant of nephrolithiasis, tobacco use disorder, and more presents the ED with a chief complaint of nausea.  Patient reports that he woke up at 6 AM Sunday morning without a problem.  He went back to sleep after he used the bathroom, got up at 9 AM and felt nauseous.  Reports he had associated headache.  He had vomiting x 1 that was nonbloody.  He reports fatigue and malaise.  He denies abdominal pain at first but then reports he has had left upper quadrant pain that he refers to his chest pain.  He denies flank pain.  He reports feeling feverish and having cold sweats Sunday a.m.  He denies any dysuria or hematuria.  Patient reports his left upper quadrant pain started at 10 PM.  He has been avoiding NSAIDs as he was advised to do so last time he had renal problems.    He does smoke 5-10 cigarettes/day.  He does not drink.  He smokes marijuana and his last use was 11/07/2022.    ED Temp 98.2, heart rate 75-94, respiratory rate 14-22, blood pressure 171/109-188/127, satting 97% No leukocytosis with a white blood cell count of 8.3, hemoglobin 13.9, platelets 215 Chemistry is unremarkable aside from a creatinine of 4.20 Urine is positive for THC UA shows proteinuria consistent with AKI and hypertensive crisis Chest x-ray shows no active disease EKG shows a heart rate of 88, normal sinus rhythm, QTc 420 Patient was given 2 L  bolus Patient was given Zofran which did not help Phenergan was added to his regimen Admission was requested for AKI    Assessment & Plan:   Principal Problem:   AKI (acute kidney injury) (HCC) Active Problems:   Tobacco use disorder   Hypertensive crisis     Assessment and Plan: * AKI (acute kidney injury) (HCC) - Creatinine increased to 4.2 Lab Results  Component Value Date   CREATININE 3.82 (H) 11/08/2022   CREATININE 4.20 (H) 11/07/2022   CREATININE 2.08 (H) 10/01/2010    - No clear baseline - History of staghorn calculus with nephrostomy tubes required back in 2012 per patient report - Denies taking NSAIDs  - Pending Ultrasound of kidneys - Discussed case with nephrologist Dr. Arrie Aran  Hypertensive crisis - Blood pressure as high as 188/127 >> 160/102 - Added Norvasc 10 mg daily -Nephrology recommendation rousing 25 mg p.o. 3 times daily will titrate up - Associated AKI at 4.2 - Chest x-ray shows no acute disease - EKG is without ischemic changes - Pain and nausea are likely contributing - Continue to monitor -Pending  renal ultrasound -Troponin normal at 15  Tobacco use disorder - Smokes 6 to 10 cigarettes/day - Declines nicotine patch at this time - Counseled on importance of cessation   ---------------------------------------------------------------------------------------------------------------------------------- Nutritional status:  The patient's BMI is: Body mass index is 26.53 kg/m. I agree with the assessment and plan as outlined ------------------------------------------------------------------------------------------------------------------------------------  DVT prophylaxis:  heparin injection 5,000 Units Start: 11/08/22 0600 SCDs Start: 11/08/22 0128   Code Status:   Code Status: Full Code  Family Communication: No family member present at bedside- attempt will be made to update daily The above findings and plan of care has been  discussed with patient (and family)  in detail,  they expressed understanding and agreement of above. -Advance care planning has been discussed.   Admission status:   Status is: Inpatient Remains inpatient appropriate because: Needing close monitor, titration of medication for better blood pressure control, IV fluids for acute renal failure   Disposition: From  - home             Planning for discharge in 1-2 days: to   Procedures:   No admission procedures for hospital encounter.   Antimicrobials:  Anti-infectives (From admission, onward)    None        Medication:   amLODipine  10 mg Oral Daily   heparin  5,000 Units Subcutaneous Q8H   hydrALAZINE  25 mg Oral TID    acetaminophen **OR** acetaminophen, hydrALAZINE, ondansetron **OR** ondansetron (ZOFRAN) IV, oxyCODONE, promethazine (PHENERGAN) injection (IM or IVPB)   Objective:   Vitals:   11/08/22 0230 11/08/22 0240 11/08/22 0301 11/08/22 0504  BP: (!) 168/114 (!) 171/99 (!) 174/112 (!) 160/102  Pulse: 95 92 (!) 101 99  Resp: (!) 22 18 19 20   Temp:   99.1 F (37.3 C)   TempSrc:   Oral   SpO2: 98% 99% 99%   Weight:      Height:        Intake/Output Summary (Last 24 hours) at 11/08/2022 1142 Last data filed at 11/08/2022 0500 Gross per 24 hour  Intake --  Output 600 ml  Net -600 ml   Filed Weights   11/07/22 2303 11/07/22 2325  Weight: 85.7 kg 74.6 kg     Physical examination:   Constitution:  Alert, cooperative, no distress,  Appears calm and comfortable  Psychiatric:   Normal and stable mood and affect, cognition intact,   HEENT:        Normocephalic, PERRL, otherwise with in Normal limits  Chest:         Chest symmetric Cardio vascular:  S1/S2, RRR, No murmure, No Rubs or Gallops  pulmonary: Clear to auscultation bilaterally, respirations unlabored, negative wheezes / crackles Abdomen: Soft, non-tender, non-distended, bowel sounds,no masses, no organomegaly Muscular skeletal: Limited exam - in  bed, able to move all 4 extremities,   Neuro: CNII-XII intact. , normal motor and sensation, reflexes intact  Extremities: No pitting edema lower extremities, +2 pulses  Skin: Dry, warm to touch, negative for any Rashes, No open wounds Wounds: per nursing documentation   ------------------------------------------------------------------------------------------------------------------------------------------    LABs:     Latest Ref Rng & Units 11/08/2022    5:08 AM 11/07/2022   11:55 PM 10/01/2010    8:41 PM  CBC  WBC 4.0 - 10.5 K/uL 6.5  8.3  7.2   Hemoglobin 13.0 - 17.0 g/dL 16.1  09.6  04.5   Hematocrit 39.0 - 52.0 % 39.5  42.1  35.9   Platelets 150 - 400 K/uL  183  215  379       Latest Ref Rng & Units 11/08/2022    5:08 AM 11/07/2022   11:55 PM 10/01/2010    8:41 PM  CMP  Glucose 70 - 99 mg/dL 88  528  413   BUN 6 - 20 mg/dL 28  29  17    Creatinine 0.61 - 1.24 mg/dL 2.44  0.10  2.72   Sodium 135 - 145 mmol/L 137  136  139   Potassium 3.5 - 5.1 mmol/L 4.5  4.4  4.4   Chloride 98 - 111 mmol/L 110  106  102   CO2 22 - 32 mmol/L 18  20  27    Calcium 8.9 - 10.3 mg/dL 8.4  9.0  53.6   Total Protein 6.5 - 8.1 g/dL 6.7  7.3    Total Bilirubin 0.3 - 1.2 mg/dL 0.9  1.0    Alkaline Phos 38 - 126 U/L 108  119    AST 15 - 41 U/L 14  16    ALT 0 - 44 U/L 16  17         Micro Results No results found for this or any previous visit (from the past 240 hour(s)).  Radiology Reports US RENAL  Result Date: 11/08/2022 CLINICAL DATA:  Acute kidney injury. History of bilateral renal calculi and previous bilateral hydronephrosis. Status post previous bilateral percutaneous nephrolithotomy in 2012 at [facility]. EXAM: RENAL / URINARY TRACT ULTRASOUND COMPLETE COMPARISON:  CT of the abdomen and pelvis without contrast on 09/21/2010 FINDINGS: Right Kidney: Renal measurements: 8.5 x 5.4 x 3.9 cm = volume: 93 mL. Cortical atrophy. Mild fullness of the collecting system without significant  hydronephrosis. Left Kidney: Renal measurements: 12.5 x 6.5 x 5.6 cm = volume: 237 mL. No hydronephrosis. Simple cyst of the mid left renal cortex measures up to 5 cm. This is consistent with a Bosniak 1 cyst and requires no follow-up. Bladder: Appears normal for degree of bladder distention. Other: None. IMPRESSION: 1. Atrophic right kidney with mild fullness of the collecting system but no significant hydronephrosis. 2. 5 cm simple cyst of the mid left renal cortex. No evidence of left-sided hydronephrosis. Electronically Signed   By: Irish Lack M.D.   On: 11/08/2022 10:49   DG Chest Portable 1 View  Result Date: 11/07/2022 CLINICAL DATA:  Cough EXAM: PORTABLE CHEST 1 VIEW COMPARISON:  Radiographs 09/26/2005 FINDINGS: Stable cardiomediastinal silhouette. No focal consolidation, pleural effusion, or pneumothorax. No displaced rib fractures. IMPRESSION: No active disease. Electronically Signed   By: Minerva Fester M.D.   On: 11/07/2022 23:42    SIGNED: Kendell Bane, MD, FHM. FAAFP. [facility] - [facility] Time spent - 35 min.  In seeing, evaluating and examining the patient. Reviewing medical records, labs, drawn plan of care. [facility],  Pager (please use [email or web address] to page/ text) Please use Epic Secure Chat for non-urgent communication (7AM-7PM)  If 7PM-7AM, please contact night-coverage [email or web address], 11/08/2022, 11:42 AM

## 2022-11-08 NOTE — Progress Notes (Signed)
Transition of Care [facility]) - Inpatient Brief Assessment   Patient Details  Name: Manuel Riggs MRN: [id number] Date of Birth: Aug 08, 1990  Transition of [facility]) CM/SW Contact:    Leitha Bleak, RN Phone Number: 11/08/2022, 12:53 PM   Clinical Narrative:   Transition of Care Department [facility]) has reviewed patient and no TOC needs have been identified at this time. We will continue to monitor patient advancement through interdisciplinary progression rounds. If new patient transition needs arise, please place a TOC consult.     Transition of Care Asessment: Insurance and Status: (P) Insurance coverage has been reviewed Patient has primary care physician: (P) No (List added) Home environment has been reviewed: (P) home with parents Prior level of function:: (P) independent Prior/Current Home Services: (P) No current home services Social Determinants of Health Reivew: (P) SDOH reviewed no interventions necessary Readmission risk has been reviewed: (P) Yes Transition of care needs: (P) no transition of care needs at this time

## 2022-11-08 NOTE — Hospital Course (Signed)
Manuel Riggs is a 32 y.o. male with medical history significant of nephrolithiasis, tobacco use disorder, and more presents the ED with a chief complaint of nausea.  Patient reports that he woke up at 6 AM Sunday morning without a problem.  He went back to sleep after he used the bathroom, got up at 9 AM and felt nauseous.  Reports he had associated headache.  He had vomiting x 1 that was nonbloody.  He reports fatigue and malaise.  He denies abdominal pain at first but then reports he has had left upper quadrant pain that he refers to his chest pain.  He denies flank pain.  He reports feeling feverish and having cold sweats Sunday a.m.  He denies any dysuria or hematuria.  Patient reports his left upper quadrant pain started at 10 PM.  He has been avoiding NSAIDs as he was advised to do so last time he had renal problems.    He does smoke 5-10 cigarettes/day.  He does not drink.  He smokes marijuana and his last use was 11/07/2022.    ED Temp 98.2, heart rate 75-94, respiratory rate 14-22, blood pressure 171/109-188/127, satting 97% No leukocytosis with a white blood cell count of 8.3, hemoglobin 13.9, platelets 215 Chemistry is unremarkable aside from a creatinine of 4.20 Urine is positive for THC UA shows proteinuria consistent with AKI and hypertensive crisis Chest x-ray shows no active disease EKG shows a heart rate of 88, normal sinus rhythm, QTc 420 Patient was given 2 L bolus Patient was given Zofran which did not help Phenergan was added to his regimen Admission was requested for AKI

## 2022-11-08 NOTE — Assessment & Plan Note (Addendum)
-   Blood pressure as high as 188/127 >> 160/102 - Added Norvasc 10 mg daily -Nephrology recommendation rousing 25 mg p.o. 3 times daily will titrate up - Associated AKI at 4.2 - Chest x-ray shows no acute disease - EKG is without ischemic changes - Pain and nausea are likely contributing - Continue to monitor -Pending renal ultrasound -Troponin normal at 15

## 2022-11-08 NOTE — Assessment & Plan Note (Addendum)
-   Creatinine increased to 4.2 Lab Results  Component Value Date   CREATININE 3.92 (H) 11/09/2022   CREATININE 3.82 (H) 11/08/2022   CREATININE 4.20 (H) 11/07/2022    - No clear baseline - History of staghorn calculus with nephrostomy tubes required back in 2012 per patient report -history of obstructive nephrolithiasis - Denies taking NSAIDs  - Ultrasound of kidneys -cystic lesion-atrophy right kidney -negative for hydronephrosis - Discussed case with nephrologist Dr. Arrie Aran Continue to avoid nephrotoxin, continue IV fluids,

## 2022-11-08 NOTE — Discharge Instructions (Signed)
  Providers Accepting New Patients in [place], [place]    [facility] [address]  [place], [place] [address] [phone] Accepts most insurances  [facility] [address] [place], [place] [address] [phone] Accepts most insurances  [facility] [address] [place], [place] [address]  [phone] Must meet requirements  [facility] [address] [place], [place] [address] [phone] Accepts most insurances  [facility] [address]  [place], [place] [address] [phone] Accepts most insurances  [facility] [address]   [place], [place]   [phone] Accepts most insurances  [facility])  [phone] [address]  [place], [place] [address] [phone] Accepts most insurances     [facility] [address]  [place], [place] [address] [phone] Accepts most insurances  [facility] [address] [place], [place] [address] [phone] option 1 Accepts Medicaid and [facility] [address]  [place], [place] [address] [phone] Accepts most insurances  Avon Gully, MD [address] [place], [place] [address] [phone] Accepts most insurances  [facility] at [facility] [address]  [place], [place] [address] [phone] Accepts most insurances  [facility] [phone] [address] [place], [place] [address] [phone] Accepts most insurances  [place], [place] [address] [place], [place] [address] [phone]  Accepts most insurances

## 2022-11-08 NOTE — Progress Notes (Signed)
Pt is alert and oriented x4. Pt is ambulatory and is able to walk independently to and from the bathroom. PT has had several episodes of nausea and pain. Pt has received several PRN medications.

## 2022-11-08 NOTE — Consult Note (Signed)
Reason for Consult: AKI/CKD stage IIIb Referring Physician: Flossie Dibble, MD  Manuel Riggs is an 32 y.o. male with a PMH significant for nephrolithiasis, CKD stage III (last known Scr 1.87 on 10/21/10) who presented to [facility] ED on 11/07/22 with 1 day history of N/V/D.  In the ED, Bp 171/109, SpO2 97%.  Labs notable for lipase 85, Co2 20, BUN 29, Cr 4.2, UA with blood and protein.  He denies any NSAID use, hematuria, dysuria, pyuria, flank pain, or retention.  He did develop a maculopapular rash on his torso a few weeks ago.  Has not had a kidney stone nor seen a physician since 2012.  No lower extremity edema, SOB, hematemesis, hematochezia, melena, or BRBPR.  Trend in Creatinine: Creatinine, Ser  Date/Time Value Ref Range Status  11/08/2022 05:08 AM 3.82 (H) 0.61 - 1.24 mg/dL Final  91/47/8295 62:13 PM 4.20 (H) 0.61 - 1.24 mg/dL Final  08/65/7846 96:29 PM 2.08 (H) 0.4 - 1.5 mg/dL Final  52/84/1324 40:10 PM 5.24 (H) 0.4 - 1.5 mg/dL Final  27/25/3664 40:34 PM 2.15 (H) 0.4 - 1.5 mg/dL Final  74/25/9563 87:56 AM 1.1 0.4 - 1.5 mg/dL Final  43/32/9518 84:16 AM 1.16 0.4 - 1.5 mg/dL Final  60/63/0160 10:93 AM 1.3 0.4 - 1.5 mg/dL Final  23/55/7322 02:54 PM 0.96 0.4 - 1.5 mg/dL Final  27/10/2374 28:31 AM 1.00  Final  03/08/2008 05:00 AM 1.05  Final  03/07/2008 05:05 AM 0.93  Final  03/06/2008 05:00 AM 0.94  Final  03/05/2008 07:56 PM 0.94  Final  12/20/2006 09:39 PM 1.06  Final    PMH:   Past Medical History:  Diagnosis Date   Chronic back pain    Chronic leg pain    Kidney calculi     PSH:   Past Surgical History:  Procedure Laterality Date   KIDNEY STONE SURGERY      Allergies:  Allergies  Allergen Reactions   Aspirin     Break out    Medications:   Prior to Admission medications   Not on File    Inpatient medications:  amLODipine  10 mg Oral Daily   heparin  5,000 Units Subcutaneous Q8H    Discontinued Meds:   Medications Discontinued During This Encounter  Medication  Reason   methocarbamol (ROBAXIN) 500 MG tablet    oxyCODONE-acetaminophen (PERCOCET/ROXICET) 5-325 MG tablet    tiopronin (THIOLA) 100 MG tablet     Social History:  reports that he has been smoking cigarettes. He does not have any smokeless tobacco history on file. He reports current drug use. Drug: Marijuana. He reports that he does not drink alcohol.  Family History:  History reviewed. No pertinent family history.  Pertinent items are noted in HPI. Weight change:   Intake/Output Summary (Last 24 hours) at 11/08/2022 0829 Last data filed at 11/08/2022 0500 Gross per 24 hour  Intake --  Output 600 ml  Net -600 ml   BP (!) 160/102   Pulse 99   Temp 99.1 F (37.3 C) (Oral)   Resp 20   Ht 5\' 6"  (1.676 m)   Wt 74.6 kg   SpO2 99%   BMI 26.53 kg/m  Vitals:   11/08/22 0230 11/08/22 0240 11/08/22 0301 11/08/22 0504  BP: (!) 168/114 (!) 171/99 (!) 174/112 (!) 160/102  Pulse: 95 92 (!) 101 99  Resp: (!) 22 18 19 20   Temp:   99.1 F (37.3 C)   TempSrc:   Oral   SpO2: 98% 99% 99%  Weight:      Height:         General appearance: alert, cooperative, and no distress Head: Normocephalic, without obvious abnormality, atraumatic Eyes: negative findings: lids and lashes normal, conjunctivae and sclerae normal, and corneas clear Resp: clear to auscultation bilaterally Cardio: regular rate and rhythm, S1, S2 normal, no murmur, click, rub or gallop GI: soft, non-tender; bowel sounds normal; no masses,  no organomegaly Extremities: extremities normal, atraumatic, no cyanosis or edema  Labs: Basic Metabolic Panel: Recent Labs  Lab 11/07/22 2355 11/08/22 0508  NA 136 137  K 4.4 4.5  CL 106 110  CO2 20* 18*  GLUCOSE 100* 88  BUN 29* 28*  CREATININE 4.20* 3.82*  ALBUMIN 3.9 3.6  CALCIUM 9.0 8.4*   Liver Function Tests: Recent Labs  Lab 11/07/22 2355 11/08/22 0508  AST 16 14*  ALT 17 16  ALKPHOS 119 108  BILITOT 1.0 0.9  PROT 7.3 6.7  ALBUMIN 3.9 3.6   Recent Labs   Lab 11/07/22 2355  LIPASE 85*   No results for input(s): "AMMONIA" in the last 168 hours. CBC: Recent Labs  Lab 11/07/22 2355 11/08/22 0508  WBC 8.3 6.5  NEUTROABS  --  5.2  HGB 13.9 13.0  HCT 42.1 39.5  MCV 89.4 90.0  PLT 215 183   PT/INR: @LABRCNTIP (inr:5) Cardiac Enzymes: )No results for input(s): "CKTOTAL", "CKMB", "CKMBINDEX", "TROPONINI" in the last 168 hours. CBG: No results for input(s): "GLUCAP" in the last 168 hours.  Iron Studies: No results for input(s): "IRON", "TIBC", "TRANSFERRIN", "FERRITIN" in the last 168 hours.  Xrays/Other Studies: DG Chest Portable 1 View  Result Date: 11/07/2022 CLINICAL DATA:  Cough EXAM: PORTABLE CHEST 1 VIEW COMPARISON:  Radiographs 09/26/2005 FINDINGS: Stable cardiomediastinal silhouette. No focal consolidation, pleural effusion, or pneumothorax. No displaced rib fractures. IMPRESSION: No active disease. Electronically Signed   By: Minerva Fester M.D.   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  Now with 1 day episode of N/V/D and Scr elevated at 4.2.  UA with blood and protein.  Will start GN workup, but likely has CKD due to nephrolithiasis/obstruction.  Awaiting renal US to r/o persistent hydronephrosis due to staghorn calculus.  He is without uremic symptoms and no indication for HD at this time.  Continue with IVF's and follow renal function.   Avoid nephrotoxic medications including NSAIDs and iodinated intravenous contrast exposure unless the latter is absolutely indicated.   Preferred narcotic agents for pain control are hydromorphone, fentanyl, and methadone. Morphine should not be used.  Avoid Baclofen and avoid oral sodium phosphate and magnesium citrate based laxatives / bowel preps.  Continue strict Input and Output monitoring. Will monitor the patient closely with you and intervene or adjust therapy as  indicated by changes in clinical status/labs   Nephrolithiasis - has not seen a Urologist in 12 years.  Awaiting renal US. Hypertensive urgency - improved Bp with meds but still not at goal.  Ok to add po hydralazine 25 mg tid and titrate as needed.  Hold off on ACE/ARB for now given AKI. N/V/D - appears to have resolved.  Workup per primary.   Julien Nordmann Tirzah Fross 11/08/2022, 8:29 AM

## 2022-11-08 NOTE — Assessment & Plan Note (Signed)
-   Smokes 6 to 10 cigarettes/day - Declines nicotine patch at this time - Counseled on importance of cessation

## 2022-11-08 NOTE — H&P (Signed)
History and Physical    Patient: Manuel Riggs QMV:[id number] DOB: June 23, 1990 DOA: 11/07/2022 DOS: the patient was seen and examined on 11/08/2022 PCP: Patient, No Pcp Per  Patient coming from: Home  Chief Complaint:  Chief Complaint  Patient presents with   Nausea   HPI: Manuel Riggs is a 32 y.o. male with medical history significant of nephrolithiasis, tobacco use disorder, and more presents the ED with a chief complaint of nausea.  Patient reports that he woke up at 6 AM Sunday morning without a problem.  He went back to sleep after he used the bathroom, got up at 9 AM and felt nauseous.  Reports he had associated headache.  He had vomiting x 1 that was nonbloody.  He reports fatigue and malaise.  He denies abdominal pain at first but then reports he has had left upper quadrant pain that he refers to his chest pain.  He denies flank pain.  He reports feeling feverish and having cold sweats Sunday a.m.  He denies any dysuria or hematuria.  Patient reports his left upper quadrant pain started at 10 PM.  It is crampy.  He reports that he had no appetite on Sunday so his last normal meal was on Saturday.  His last normal bowel movement was on Saturday.  Patient has had no changes in his urine frequency or volume.  Patient reports she has been taking plenty of p.o. fluids, and has been avoiding NSAIDs as he was advised to do so last time he had renal problems.  Patient has no other complaints at this time.  He does smoke 5-10 cigarettes/day.  He does not drink.  He smokes marijuana and his last use was 11/07/2022.  He reports he usually takes marijuana for nausea.  It did not help.  Patient reports that he is full code. Review of Systems: As mentioned in the history of present illness. All other systems reviewed and are negative. Past Medical History:  Diagnosis Date   Chronic back pain    Chronic leg pain    Kidney calculi    Past Surgical History:  Procedure Laterality Date   KIDNEY STONE  SURGERY     Social History:  reports that he has been smoking cigarettes. He does not have any smokeless tobacco history on file. He reports current drug use. Drug: Marijuana. He reports that he does not drink alcohol.  Allergies  Allergen Reactions   Aspirin     Break out    History reviewed. No pertinent family history.  Prior to Admission medications   Not on File    Physical Exam: Vitals:   11/08/22 0154 11/08/22 0230 11/08/22 0240 11/08/22 0301  BP: (!) 167/106 (!) 168/114 (!) 171/99 (!) 174/112  Pulse: (!) 106 95 92 (!) 101  Resp: (!) 26 (!) 22 18 19   Temp:    99.1 F (37.3 C)  TempSrc:    Oral  SpO2: 100% 98% 99% 99%  Weight:      Height:       1.  General: Patient lying supine in bed,  no acute distress   2. Psychiatric: Alert and oriented x 3, mood and behavior normal for situation, pleasant and cooperative with exam   3. Neurologic: Speech and language are normal, face is symmetric, moves all 4 extremities voluntarily, at baseline without acute deficits on limited exam   4. HEENMT:  Head is atraumatic, normocephalic, pupils reactive to light, neck is supple, trachea is midline, mucous membranes are  moist   5. Respiratory : Lungs are clear to auscultation bilaterally without wheezing, rhonchi, rales, no cyanosis, no increase in work of breathing or accessory muscle use   6. Cardiovascular : Heart rate normal, rhythm is regular, no murmurs, rubs or gallops, no peripheral edema, peripheral pulses palpated   7. Gastrointestinal:  Abdomen is soft, nondistended, nontender to palpation bowel sounds active, no masses or organomegaly palpated   8. Skin:  Skin is warm, dry and intact without rashes, acute lesions, or ulcers on limited exam   9.Musculoskeletal:  No acute deformities or trauma, no asymmetry in tone, no peripheral edema, peripheral pulses palpated, no tenderness to palpation in the extremities  Data Reviewed: In the ED Temp 98.2, heart rate  75-94, respiratory rate 14-22, blood pressure 171/109-188/127, satting 97% No leukocytosis with a white blood cell count of 8.3, hemoglobin 13.9, platelets 215 Chemistry is unremarkable aside from a creatinine of 4.20 Urine is positive for THC UA shows proteinuria consistent with AKI and hypertensive crisis Chest x-ray shows no active disease EKG shows a heart rate of 88, normal sinus rhythm, QTc 420 Patient was given 2 L bolus Patient was given Zofran which did not help Phenergan was added to his regimen Admission was requested for AKI  Assessment and Plan: * AKI (acute kidney injury) (HCC) - Creatinine increased to 4.2 - No clear baseline - History of staghorn calculus with nephrostomy tubes required back in 2012 per patient report - Denies taking NSAIDs - Reports decreased p.o. solid food intake but still maintaining p.o. fluid intake - I personally spoke to nephro and they will see patient in the a.m. - Nephro improved waiting for a.m. for ultrasound of kidneys - Trend creatinine with a.m. labs  Hypertensive crisis - Blood pressure as high as 188/127 - Hydralazine at admission - Associated AKI at 4.2 - Chest x-ray shows no acute disease - EKG is without ischemic changes - Pain and nausea are likely contributing - Continue to monitor  Tobacco use disorder - Smokes 6 to 10 cigarettes/day - Declines nicotine patch at this time - Counseled on importance of cessation      Advance Care Planning:   Code Status: Full Code  Consults: Nephrology  Family Communication: No family at bedside  Severity of Illness: The appropriate patient status for this patient is INPATIENT. Inpatient status is judged to be reasonable and necessary in order to provide the required intensity of service to ensure the patient's safety. The patient's presenting symptoms, physical exam findings, and initial radiographic and laboratory data in the context of their chronic comorbidities is felt to place  them at high risk for further clinical deterioration. Furthermore, it is not anticipated that the patient will be medically stable for discharge from the hospital within 2 midnights of admission.   * I certify that at the point of admission it is my clinical judgment that the patient will require inpatient hospital care spanning beyond 2 midnights from the point of admission due to high intensity of service, high risk for further deterioration and high frequency of surveillance required.*  Author: Lilyan Gilford, DO 11/08/2022 5:01 AM  For on call review [email or web address].

## 2022-11-09 ENCOUNTER — Inpatient Hospital Stay (HOSPITAL_COMMUNITY): Payer: 59

## 2022-11-09 ENCOUNTER — Encounter (HOSPITAL_COMMUNITY): Payer: Self-pay | Admitting: Family Medicine

## 2022-11-09 DIAGNOSIS — E872 Acidosis, unspecified: Secondary | ICD-10-CM | POA: Clinically undetermined

## 2022-11-09 DIAGNOSIS — N179 Acute kidney failure, unspecified: Secondary | ICD-10-CM | POA: Diagnosis not present

## 2022-11-09 LAB — EXTRACTABLE NUCLEAR ANTIGEN ANTIBODY
ENA SM Ab Ser-aCnc: <0.2 AI (ref 0.0–0.9)
Ribonucleic Protein: 0.2 AI (ref 0.0–0.9)
SSA (Ro) (ENA) Antibody, IgG: <0.2 AI (ref 0.0–0.9)
SSB (La) (ENA) Antibody, IgG: <0.2 AI (ref 0.0–0.9)
Scleroderma (Scl-70) (ENA) Antibody, IgG: <0.2 AI (ref 0.0–0.9)
ds DNA Ab: <1 IU/mL (ref 0–9)

## 2022-11-09 LAB — BASIC METABOLIC PANEL
Anion gap: 12 (ref 5–15)
BUN: 30 mg/dL — ABNORMAL HIGH (ref 6–20)
CO2: 18 mmol/L — ABNORMAL LOW (ref 22–32)
Calcium: 8.4 mg/dL — ABNORMAL LOW (ref 8.9–10.3)
Chloride: 108 mmol/L (ref 98–111)
Creatinine, Ser: 3.92 mg/dL — ABNORMAL HIGH (ref 0.61–1.24)
GFR, Estimated: 20 mL/min — ABNORMAL LOW (ref >60)
Glucose, Bld: 78 mg/dL (ref 70–99)
Potassium: 4.6 mmol/L (ref 3.5–5.1)
Sodium: 138 mmol/L (ref 135–145)

## 2022-11-09 LAB — ANCA TITERS
Atypical P-ANCA titer: <1:20 {titer}
C-ANCA: <1:20 {titer}
P-ANCA: <1:20 {titer}

## 2022-11-09 LAB — ANTI-JO 1 ANTIBODY, IGG: Anti JO-1: <0.2 AI (ref 0.0–0.9)

## 2022-11-09 LAB — ANA W/REFLEX IF POSITIVE: Anti Nuclear Antibody (ANA): NEGATIVE

## 2022-11-09 MED ORDER — SODIUM CHLORIDE 0.9 % IV BOLUS
250.0000 mL | Freq: Once | INTRAVENOUS | Status: AC
  Administered 2022-11-09: 250 mL via INTRAVENOUS

## 2022-11-09 MED ORDER — HYDRALAZINE HCL 25 MG PO TABS
50.0000 mg | ORAL_TABLET | Freq: Three times a day (TID) | ORAL | Status: DC
  Administered 2022-11-09 – 2022-11-10 (×5): 50 mg via ORAL
  Filled 2022-11-09 (×5): qty 2

## 2022-11-09 MED ORDER — SODIUM BICARBONATE 650 MG PO TABS
650.0000 mg | ORAL_TABLET | Freq: Two times a day (BID) | ORAL | Status: DC
  Administered 2022-11-09 – 2022-11-11 (×5): 650 mg via ORAL
  Filled 2022-11-09 (×5): qty 1

## 2022-11-09 MED ORDER — SODIUM CHLORIDE 0.9 % IV SOLN: INTRAVENOUS | Status: AC

## 2022-11-09 NOTE — Progress Notes (Addendum)
Patient ID: Manuel Riggs, male   DOB: 04-Aug-1990, 32 y.o.   MRN: [id number] S: Reports that his nausea has worsened and is taking anti-emetics and pain meds for RUQ pain, which also worsens his nausea. O:BP (!) 144/92   Pulse 88   Temp 98.3 F (36.8 C)   Resp 18   Ht 5\' 6"  (1.676 m)   Wt 74.6 kg   SpO2 97%   BMI 26.53 kg/m   Intake/Output Summary (Last 24 hours) at 11/09/2022 0905 Last data filed at 11/09/2022 0500 Gross per 24 hour  Intake 1440 ml  Output 2600 ml  Net -1160 ml   Intake/Output: I/O last 3 completed shifts: In: 1440 [P.O.:1440] Out: 3200 [Urine:3200]  Intake/Output this shift:  No intake/output data recorded. Weight change:  Gen: NAD CVS: RRR Resp:CTA Abd: +BS, soft, mild RUQ tenderness to palpation, no guarding or rebound. Ext: no edema  Recent Labs  Lab 11/07/22 2355 11/08/22 0508 11/09/22 0416  NA 136 137 138  K 4.4 4.5 4.6  CL 106 110 108  CO2 20* 18* 18*  GLUCOSE 100* 88 78  BUN 29* 28* 30*  CREATININE 4.20* 3.82* 3.92*  ALBUMIN 3.9 3.6  --   CALCIUM 9.0 8.4* 8.4*  AST 16 14*  --   ALT 17 16  --    Liver Function Tests: Recent Labs  Lab 11/07/22 2355 11/08/22 0508  AST 16 14*  ALT 17 16  ALKPHOS 119 108  BILITOT 1.0 0.9  PROT 7.3 6.7  ALBUMIN 3.9 3.6   Recent Labs  Lab 11/07/22 2355  LIPASE 85*   No results for input(s): "AMMONIA" in the last 168 hours. CBC: Recent Labs  Lab 11/07/22 2355 11/08/22 0508  WBC 8.3 6.5  NEUTROABS  --  5.2  HGB 13.9 13.0  HCT 42.1 39.5  MCV 89.4 90.0  PLT 215 183   Cardiac Enzymes: No results for input(s): "CKTOTAL", "CKMB", "CKMBINDEX", "TROPONINI" in the last 168 hours. CBG: No results for input(s): "GLUCAP" in the last 168 hours.  Iron Studies: No results for input(s): "IRON", "TIBC", "TRANSFERRIN", "FERRITIN" in the last 72 hours. Studies/Results: US RENAL  Result Date: 11/08/2022 CLINICAL DATA:  Acute kidney injury. History of bilateral renal calculi and previous bilateral  hydronephrosis. Status post previous bilateral percutaneous nephrolithotomy in 2012 at [facility]. EXAM: RENAL / URINARY TRACT ULTRASOUND COMPLETE COMPARISON:  CT of the abdomen and pelvis without contrast on 09/21/2010 FINDINGS: Right Kidney: Renal measurements: 8.5 x 5.4 x 3.9 cm = volume: 93 mL. Cortical atrophy. Mild fullness of the collecting system without significant hydronephrosis. Left Kidney: Renal measurements: 12.5 x 6.5 x 5.6 cm = volume: 237 mL. No hydronephrosis. Simple cyst of the mid left renal cortex measures up to 5 cm. This is consistent with a Bosniak 1 cyst and requires no follow-up. Bladder: Appears normal for degree of bladder distention. Other: None. IMPRESSION: 1. Atrophic right kidney with mild fullness of the collecting system but no significant hydronephrosis. 2. 5 cm simple cyst of the mid left renal cortex. No evidence of left-sided hydronephrosis. Electronically Signed   By: Irish Lack M.D.   On: 11/08/2022 10:49   DG Chest Portable 1 View  Result Date: 11/07/2022 CLINICAL DATA:  Cough EXAM: PORTABLE CHEST 1 VIEW COMPARISON:  Radiographs 09/26/2005 FINDINGS: Stable cardiomediastinal silhouette. No focal consolidation, pleural effusion, or pneumothorax. No displaced rib fractures. IMPRESSION: No active disease. Electronically Signed   By: Minerva Fester M.D.   On: 11/07/2022 23:42  amLODipine  10 mg Oral Daily   heparin  5,000 Units Subcutaneous Q8H   hydrALAZINE  50 mg Oral TID    BMET    Component Value Date/Time   NA 138 11/09/2022 0416   K 4.6 11/09/2022 0416   CL 108 11/09/2022 0416   CO2 18 (L) 11/09/2022 0416   GLUCOSE 78 11/09/2022 0416   BUN 30 (H) 11/09/2022 0416   CREATININE 3.92 (H) 11/09/2022 0416   CALCIUM 8.4 (L) 11/09/2022 0416   GFRNONAA 20 (L) 11/09/2022 0416   GFRAA (L) 10/01/2010 2041    49        The eGFR has been calculated using the MDRD equation. This calculation has not been validated in all clinical situations. eGFR's  persistently <60 mL/min signify possible Chronic Kidney Disease.   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  Now with 1 day episode of N/V/D and Scr elevated at 4.2.  UA with blood and protein.  Will start GN workup, but likely has CKD due to nephrolithiasis/obstruction.  Renal US without persistent hydronephrosis but did reveal atrophic right kidney and some mild fullness of the collecting system without significant hydronephrosis.  His Scr has only mildly improved with IVF's so this may be his new baseline due to his history of obstructive nephrolithiasis.  He is having ongoing nausea but given his BUN of only 30 I do not feel these are uremic symptoms.  No indication for HD at this time.  Continue with IVF's and follow renal function.   Avoid nephrotoxic medications including NSAIDs and iodinated intravenous contrast exposure unless the latter is absolutely indicated.   Preferred narcotic agents for pain control are hydromorphone, fentanyl, and methadone. Morphine should not be used.  Avoid Baclofen and avoid oral sodium phosphate and magnesium citrate based laxatives / bowel preps.  Continue strict Input and Output monitoring. Will monitor the patient closely with you and intervene or adjust therapy as indicated by changes in clinical status/labs   N/V/D - diarrhea has improved, however his nausea and RUQ abdominal pain have worsened.  Recommend evaluation of his gallbladder with RUQ [place].    Nephrolithiasis - has not seen a Urologist in 12 years.  Awaiting renal US. Hypertensive urgency - improved Bp with meds but still not at goal.  Ok to add po hydralazine 25 mg tid and titrate as needed.  Hold off on ACE/ARB for now given AKI. Metabolic acidosis - likely due to his AKI/CKD.  Will start oral sodium bicarbonate and follow. 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   Irena Cords, MD [facility]

## 2022-11-09 NOTE — Progress Notes (Signed)
PROGRESS NOTE    Patient: Manuel Riggs                            PCP: Patient, No Pcp Per                    DOB: 1990/07/04            DOA: 11/07/2022 JYN:[id number]             DOS: 11/09/2022, 12:12 PM   LOS: 1 day   Date of Service: The patient was seen and examined on 11/09/2022  Subjective:   The patient was seen and examined this morning, stable no acute distress. Was complaining overnight for generalized aches and pains.  Persistent nausea  Brief Narrative:    Manuel Riggs is a 32 y.o. male with medical history significant of nephrolithiasis, tobacco use disorder, and more presents the ED with a chief complaint of nausea.  Patient reports that he woke up at 6 AM Sunday morning without a problem.  He went back to sleep after he used the bathroom, got up at 9 AM and felt nauseous.  Reports he had associated headache.  He had vomiting x 1 that was nonbloody.  He reports fatigue and malaise.  He denies abdominal pain at first but then reports he has had left upper quadrant pain that he refers to his chest pain.  He denies flank pain.  He reports feeling feverish and having cold sweats Sunday a.m.  He denies any dysuria or hematuria.  Patient reports his left upper quadrant pain started at 10 PM.  He has been avoiding NSAIDs as he was advised to do so last time he had renal problems.    He does smoke 5-10 cigarettes/day.  He does not drink.  He smokes marijuana and his last use was 11/07/2022.    ED Temp 98.2, heart rate 75-94, respiratory rate 14-22, blood pressure 171/109-188/127, satting 97% No leukocytosis with a white blood cell count of 8.3, hemoglobin 13.9, platelets 215 Chemistry is unremarkable aside from a creatinine of 4.20 Urine is positive for THC UA shows proteinuria consistent with AKI and hypertensive crisis Chest x-ray shows no active disease EKG shows a heart rate of 88, normal sinus rhythm, QTc 420 Patient was given 2 L bolus Patient was given Zofran which did not  help Phenergan was added to his regimen Admission was requested for AKI    Assessment & Plan:   Principal Problem:   AKI (acute kidney injury) (HCC) Active Problems:   Tobacco use disorder   Hypertensive crisis   Metabolic acidosis     Assessment and Plan: * AKI (acute kidney injury) (HCC) - Creatinine increased to 4.2 Lab Results  Component Value Date   CREATININE 3.92 (H) 11/09/2022   CREATININE 3.82 (H) 11/08/2022   CREATININE 4.20 (H) 11/07/2022    - No clear baseline - History of staghorn calculus with nephrostomy tubes required back in 2012 per patient report -history of obstructive nephrolithiasis - Denies taking NSAIDs  - Ultrasound of kidneys -cystic lesion-atrophy right kidney -negative for hydronephrosis - Discussed case with nephrologist Dr. Arrie Aran Continue to avoid nephrotoxin, continue IV fluids,  Metabolic acidosis Nephrology has added sodium bicarb p.o. -Trend electrolytes closely -Continue IV fluids  Hypertensive crisis - Blood pressure as high as 188/127 >> 160/102 >>  - Added Norvasc 10 mg daily -Nephrology recommendation rousing 25 mg >>> titrating up to 50  mg around 3 times daily - HTN associated AKI -therefore avoiding ACE/ARB's - Chest x-ray shows no acute disease - EKG is without ischemic changes - Pain and nausea are likely contributing - Continue to monitor  -Troponin normal at 15  Tobacco use disorder - Smokes 6 to 10 cigarettes/day - Declines nicotine patch at this time - Counseled on importance of cessation   ---------------------------------------------------------------------------------------------------------------------------------- Nutritional status:  The patient's BMI is: Body mass index is 26.53 kg/m. I agree with the assessment and plan as outlined ------------------------------------------------------------------------------------------------------------------------------------  DVT prophylaxis:  heparin  injection 5,000 Units Start: 11/08/22 0600 SCDs Start: 11/08/22 0128   Code Status:   Code Status: Full Code  Family Communication: No family member present at bedside-    Admission status:   Status is: Inpatient Remains inpatient appropriate because: Needing close monitor, titration of medication for better blood pressure control, IV fluids for acute renal failure   Disposition: From  - home             Planning for discharge in 1-2 days: to HOME    Procedures:   No admission procedures for hospital encounter.   Antimicrobials:  Anti-infectives (From admission, onward)    None        Medication:   amLODipine  10 mg Oral Daily   heparin  5,000 Units Subcutaneous Q8H   hydrALAZINE  50 mg Oral TID   sodium bicarbonate  650 mg Oral BID    acetaminophen **OR** acetaminophen, hydrALAZINE, HYDROmorphone (DILAUDID) injection, ondansetron **OR** ondansetron (ZOFRAN) IV, oxyCODONE, promethazine (PHENERGAN) injection (IM or IVPB)   Objective:   Vitals:   11/08/22 1539 11/08/22 2002 11/09/22 0356 11/09/22 0925  BP: (!) 142/79 125/76 (!) 144/92 (!) 184/112  Pulse: 87 91 88 (!) 109  Resp:  20 18 20   Temp:   98.3 F (36.8 C) 99.3 F (37.4 C)  TempSrc:    Oral  SpO2:  98% 97% 97%  Weight:      Height:        Intake/Output Summary (Last 24 hours) at 11/09/2022 1212 Last data filed at 11/09/2022 0900 Gross per 24 hour  Intake 1440 ml  Output 1800 ml  Net -360 ml   Filed Weights   11/07/22 2303 11/07/22 2325  Weight: 85.7 kg 74.6 kg     Physical examination:     General:  AAO x 3,  cooperative, no distress;   HEENT:  Normocephalic, PERRL, otherwise with in Normal limits   Neuro:  CNII-XII intact. , normal motor and sensation, reflexes intact   Lungs:   Clear to auscultation BL, Respirations unlabored,  No wheezes / crackles  Cardio:    S1/S2, RRR, No murmure, No Rubs or Gallops   Abdomen:  Soft, non-tender, bowel sounds active all four quadrants, no  guarding or peritoneal signs.  Muscular  skeletal:  Limited exam -global generalized weaknesses - in bed, able to move all 4 extremities,   2+ pulses,  symmetric, No pitting edema        ------------------------------------------------------------------------------------------------------------------------------------------    LABs:     Latest Ref Rng & Units 11/08/2022    5:08 AM 11/07/2022   11:55 PM 10/01/2010    8:41 PM  CBC  WBC 4.0 - 10.5 K/uL 6.5  8.3  7.2   Hemoglobin 13.0 - 17.0 g/dL 78.2  95.6  21.3   Hematocrit 39.0 - 52.0 % 39.5  42.1  35.9   Platelets 150 - 400 K/uL 183  215  379  Latest Ref Rng & Units 11/09/2022    4:16 AM 11/08/2022    5:08 AM 11/07/2022   11:55 PM  CMP  Glucose 70 - 99 mg/dL 78  88  409   BUN 6 - 20 mg/dL 30  28  29    Creatinine 0.61 - 1.24 mg/dL 8.11  9.14  7.82   Sodium 135 - 145 mmol/L 138  137  136   Potassium 3.5 - 5.1 mmol/L 4.6  4.5  4.4   Chloride 98 - 111 mmol/L 108  110  106   CO2 22 - 32 mmol/L 18  18  20    Calcium 8.9 - 10.3 mg/dL 8.4  8.4  9.0   Total Protein 6.5 - 8.1 g/dL  6.7  7.3   Total Bilirubin 0.3 - 1.2 mg/dL  0.9  1.0   Alkaline Phos 38 - 126 U/L  108  119   AST 15 - 41 U/L  14  16   ALT 0 - 44 U/L  16  17        Micro Results No results found for this or any previous visit (from the past 240 hour(s)).  Radiology Reports No results found.  SIGNED: Kendell Bane, MD, FHM. FAAFP. [facility] - [facility] Time spent - 35 min.  In seeing, evaluating and examining the patient. Reviewing medical records, labs, drawn plan of care. [facility],  Pager (please use [email or web address] to page/ text) Please use Epic Secure Chat for non-urgent communication (7AM-7PM)  If 7PM-7AM, please contact night-coverage [email or web address], 11/09/2022, 12:12 PM

## 2022-11-09 NOTE — Assessment & Plan Note (Signed)
Nephrology has added sodium bicarb p.o. -Trend electrolytes closely -Continue IV fluids

## 2022-11-10 DIAGNOSIS — N179 Acute kidney failure, unspecified: Secondary | ICD-10-CM | POA: Diagnosis not present

## 2022-11-10 LAB — CBC WITH DIFFERENTIAL/PLATELET
Abs Immature Granulocytes: 0.01 10*3/uL (ref 0.00–0.07)
Basophils Absolute: 0 10*3/uL (ref 0.0–0.1)
Basophils Relative: 1 %
Eosinophils Absolute: 0.2 10*3/uL (ref 0.0–0.5)
Eosinophils Relative: 3 %
HCT: 39.3 % (ref 39.0–52.0)
Hemoglobin: 12.7 g/dL — ABNORMAL LOW (ref 13.0–17.0)
Immature Granulocytes: 0 %
Lymphocytes Relative: 16 %
Lymphs Abs: 1 10*3/uL (ref 0.7–4.0)
MCH: 29.1 pg (ref 26.0–34.0)
MCHC: 32.3 g/dL (ref 30.0–36.0)
MCV: 90.1 fL (ref 80.0–100.0)
Monocytes Absolute: 0.6 10*3/uL (ref 0.1–1.0)
Monocytes Relative: 10 %
Neutro Abs: 4.3 10*3/uL (ref 1.7–7.7)
Neutrophils Relative %: 70 %
Platelets: 199 10*3/uL (ref 150–400)
RBC: 4.36 MIL/uL (ref 4.22–5.81)
RDW: 14 % (ref 11.5–15.5)
WBC: 6.1 10*3/uL (ref 4.0–10.5)
nRBC: 0 % (ref 0.0–0.2)

## 2022-11-10 LAB — RENAL FUNCTION PANEL
Albumin: 3.2 g/dL — ABNORMAL LOW (ref 3.5–5.0)
Anion gap: 10 (ref 5–15)
BUN: 29 mg/dL — ABNORMAL HIGH (ref 6–20)
CO2: 19 mmol/L — ABNORMAL LOW (ref 22–32)
Calcium: 8.3 mg/dL — ABNORMAL LOW (ref 8.9–10.3)
Chloride: 109 mmol/L (ref 98–111)
Creatinine, Ser: 3.78 mg/dL — ABNORMAL HIGH (ref 0.61–1.24)
GFR, Estimated: 21 mL/min — ABNORMAL LOW (ref >60)
Glucose, Bld: 84 mg/dL (ref 70–99)
Phosphorus: 3.3 mg/dL (ref 2.5–4.6)
Potassium: 4.3 mmol/L (ref 3.5–5.1)
Sodium: 138 mmol/L (ref 135–145)

## 2022-11-10 LAB — C4 COMPLEMENT: Complement C4, Body Fluid: 26 mg/dL (ref 12–38)

## 2022-11-10 LAB — COMPLEMENT, TOTAL: Compl, Total (CH50): >60 U/mL (ref >41)

## 2022-11-10 LAB — C3 COMPLEMENT: C3 Complement: 107 mg/dL (ref 82–167)

## 2022-11-10 LAB — ANTISTREPTOLYSIN O TITER: ASO: 42 IU/mL (ref 0.0–200.0)

## 2022-11-10 MED ORDER — ENSURE MAX PROTEIN PO LIQD
11.0000 [oz_av] | Freq: Every day | ORAL | Status: DC
  Administered 2022-11-10 – 2022-11-11 (×2): 11 [oz_av] via ORAL

## 2022-11-10 MED ORDER — SODIUM CHLORIDE 0.9 % IV SOLN: INTRAVENOUS | Status: DC

## 2022-11-10 MED ORDER — HYDRALAZINE HCL 25 MG PO TABS
50.0000 mg | ORAL_TABLET | Freq: Three times a day (TID) | ORAL | Status: DC
  Administered 2022-11-10: 50 mg via ORAL
  Filled 2022-11-10 (×2): qty 2

## 2022-11-10 MED ORDER — ISOSORBIDE MONONITRATE ER 60 MG PO TB24
30.0000 mg | ORAL_TABLET | Freq: Every day | ORAL | Status: DC
  Administered 2022-11-10 – 2022-11-11 (×2): 30 mg via ORAL
  Filled 2022-11-10 (×2): qty 1

## 2022-11-10 MED ORDER — HYDRALAZINE HCL 25 MG PO TABS: 100.0000 mg | ORAL_TABLET | Freq: Three times a day (TID) | ORAL | Status: DC

## 2022-11-10 MED ORDER — PROMETHAZINE HCL 25 MG/ML IJ SOLN
INTRAMUSCULAR | Status: AC
  Filled 2022-11-10: qty 1

## 2022-11-10 NOTE — Progress Notes (Signed)
Initial Nutrition Assessment  DOCUMENTATION CODES:      INTERVENTION:  Magic cup with lunch daily   Ensure MAX daily  NUTRITION DIAGNOSIS:   Inadequate oral intake related to nausea, vomiting as evidenced by per patient/family report (Meal intake 0-50%).   GOAL:  Patient will meet greater than or equal to 90% of their needs   MONITOR:  PO intake, Supplement acceptance, Weight trends, Labs  REASON FOR ASSESSMENT:   Malnutrition Screening Tool    ASSESSMENT: Patient is a 32 yo male with hx of chronic back pain. Tobacco smoker.   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 Nephrology following.   Patient still feeling nauseated per chart. His intake has ranged 0-50% of all but one meal. Patient reports vomiting yesterday but not today. Complains of abdominal pain during RD visit.   Weight history reviewed. Currently 74.6 kg.   Meds: promethazine prn, dilaudid prn     Latest Ref Rng & Units 11/10/2022    4:22 AM 11/09/2022    4:16 AM 11/08/2022    5:08 AM  BMP  Glucose 70 - 99 mg/dL 84  78  88   BUN 6 - 20 mg/dL 29  30  28    Creatinine 0.61 - 1.24 mg/dL 2.95  6.21  3.08   Sodium 135 - 145 mmol/L 138  138  137   Potassium 3.5 - 5.1 mmol/L 4.3  4.6  4.5   Chloride 98 - 111 mmol/L 109  108  110   CO2 22 - 32 mmol/L 19  18  18    Calcium 8.9 - 10.3 mg/dL 8.3  8.4  8.4       NUTRITION - FOCUSED PHYSICAL EXAM:  Unable to complete- patient fully clothed  Diet Order:   Diet Order             Diet Heart Room service appropriate? Yes; Fluid consistency: Thin  Diet effective now                   EDUCATION NEEDS:  Education needs have been addressed  Skin:  Skin Assessment: Reviewed RN Assessment  Last BM:  unknown  Height:   Ht Readings from Last 1 Encounters:  11/07/22 5\' 6"  (1.676 m)    Weight:   Wt Readings from Last 1 Encounters:  11/07/22 74.6 kg    Ideal Body Weight:   65 kg  BMI:  Body mass index is 26.53  kg/m.  Estimated Nutritional Needs:   Kcal:  2100-2300  Protein:  90-95 gr  Fluid:  > 2 liters daily  Royann Shivers MS,RD,CSG,LDN Contact: Loretha Stapler

## 2022-11-10 NOTE — Progress Notes (Signed)
Patient ID: Manuel Riggs, male   DOB: [birth year], 32 y.o.   MRN: [id number]  S: great UOP-  crt down a little again-  still with nausea-- [place] negative-  now has had CT   O:BP (!) 140/96 (BP Location: Left Arm)   Pulse 80   Temp 99.5 F (37.5 C)   Resp 18   Ht 5\' 6"  (1.676 m)   Wt 74.6 kg   SpO2 98%   BMI 26.53 kg/m   Intake/Output Summary (Last 24 hours) at 11/10/2022 0742 Last data filed at 11/10/2022 4098 Gross per 24 hour  Intake 2697.5 ml  Output 2250 ml  Net 447.5 ml   Intake/Output: I/O last 3 completed shifts: In: 3417.5 [P.O.:2530; I.V.:787.5; IV Piggyback:100] Out: 3450 [Urine:3450]  Intake/Output this shift:  No intake/output data recorded. Weight change:  Gen: NAD CVS: RRR Resp:CTA Abd: +BS, soft, mild RUQ tenderness to palpation, no guarding or rebound. Ext: no edema  Recent Labs  Lab 11/07/22 2355 11/08/22 0508 11/09/22 0416 11/10/22 0422  NA 136 137 138 138  K 4.4 4.5 4.6 4.3  CL 106 110 108 109  CO2 20* 18* 18* 19*  GLUCOSE 100* 88 78 84  BUN 29* 28* 30* 29*  CREATININE 4.20* 3.82* 3.92* 3.78*  ALBUMIN 3.9 3.6  --  3.2*  CALCIUM 9.0 8.4* 8.4* 8.3*  PHOS  --   --   --  3.3  AST 16 14*  --   --   ALT 17 16  --   --    Liver Function Tests: Recent Labs  Lab 11/07/22 2355 11/08/22 0508 11/10/22 0422  AST 16 14*  --   ALT 17 16  --   ALKPHOS 119 108  --   BILITOT 1.0 0.9  --   PROT 7.3 6.7  --   ALBUMIN 3.9 3.6 3.2*   Recent Labs  Lab 11/07/22 2355  LIPASE 85*   No results for input(s): "AMMONIA" in the last 168 hours. CBC: Recent Labs  Lab 11/07/22 2355 11/08/22 0508 11/10/22 0701  WBC 8.3 6.5 6.1  NEUTROABS  --  5.2 4.3  HGB 13.9 13.0 12.7*  HCT 42.1 39.5 39.3  MCV 89.4 90.0 90.1  PLT 215 183 199   Cardiac Enzymes: No results for input(s): "CKTOTAL", "CKMB", "CKMBINDEX", "TROPONINI" in the last 168 hours. CBG: No results for input(s): "GLUCAP" in the last 168 hours.  Iron Studies: No results for input(s): "IRON",  "TIBC", "TRANSFERRIN", "FERRITIN" in the last 72 hours. Studies/Results: US Abdomen Limited RUQ (LIVER/GB)  Result Date: 11/09/2022 CLINICAL DATA:  Right upper quadrant pain and nausea for 3 days EXAM: ULTRASOUND ABDOMEN LIMITED RIGHT UPPER QUADRANT COMPARISON:  Renal ultrasound 11/08/2022.  CT scan 09/16/2019 FINDINGS: Gallbladder: No gallstones or wall thickening visualized. No sonographic Murphy sign noted by sonographer. Common bile duct: Diameter: 3 mm Liver: No focal lesion identified. Within normal limits in parenchymal echogenicity. Portal vein is patent on color Doppler imaging with normal direction of blood flow towards the liver. Other: None. IMPRESSION: No gallstones or ductal dilatation. Electronically Signed   By: Karen Kays M.D.   On: 11/09/2022 13:57   US RENAL  Result Date: 11/08/2022 CLINICAL DATA:  Acute kidney injury. History of bilateral renal calculi and previous bilateral hydronephrosis. Status post previous bilateral percutaneous nephrolithotomy in 2012 at [facility]. EXAM: RENAL / URINARY TRACT ULTRASOUND COMPLETE COMPARISON:  CT of the abdomen and pelvis without contrast on 09/21/2010 FINDINGS: Right Kidney: Renal measurements: 8.5 x 5.4  x 3.9 cm = volume: 93 mL. Cortical atrophy. Mild fullness of the collecting system without significant hydronephrosis. Left Kidney: Renal measurements: 12.5 x 6.5 x 5.6 cm = volume: 237 mL. No hydronephrosis. Simple cyst of the mid left renal cortex measures up to 5 cm. This is consistent with a Bosniak 1 cyst and requires no follow-up. Bladder: Appears normal for degree of bladder distention. Other: None. IMPRESSION: 1. Atrophic right kidney with mild fullness of the collecting system but no significant hydronephrosis. 2. 5 cm simple cyst of the mid left renal cortex. No evidence of left-sided hydronephrosis. Electronically Signed   By: Irish Lack M.D.   On: 11/08/2022 10:49    amLODipine  10 mg Oral Daily   heparin  5,000 Units  Subcutaneous Q8H   hydrALAZINE  50 mg Oral TID   sodium bicarbonate  650 mg Oral BID    BMET    Component Value Date/Time   NA 138 11/10/2022 0422   K 4.3 11/10/2022 0422   CL 109 11/10/2022 0422   CO2 19 (L) 11/10/2022 0422   GLUCOSE 84 11/10/2022 0422   BUN 29 (H) 11/10/2022 0422   CREATININE 3.78 (H) 11/10/2022 0422   CALCIUM 8.3 (L) 11/10/2022 0422   GFRNONAA 21 (L) 11/10/2022 0422   GFRAA (L) 10/01/2010 2041    49        The eGFR has been calculated using the MDRD equation. This calculation has not been validated in all clinical situations. eGFR's persistently <60 mL/min signify possible Chronic Kidney Disease.   CBC    Component Value Date/Time   WBC 6.1 11/10/2022 0701   RBC 4.36 11/10/2022 0701   HGB 12.7 (L) 11/10/2022 0701   HCT 39.3 11/10/2022 0701   PLT 199 11/10/2022 0701   MCV 90.1 11/10/2022 0701   MCH 29.1 11/10/2022 0701   MCHC 32.3 11/10/2022 0701   RDW 14.0 11/10/2022 0701   LYMPHSABS 1.0 11/10/2022 0701   MONOABS 0.6 11/10/2022 0701   EOSABS 0.2 11/10/2022 0701   BASOSABS 0.0 11/10/2022 0701     Assessment/Plan:  AKI/CKD stage IIIb vs progressive CKD - last known Scr was 1.86 in June 2012 following obstructive nephrolithiasis.  Now with N/V/D and Scr elevated at 4.2.  UA with blood and protein.  GN workup completely negative even the ESR. , but likely has CKD due to nephrolithiasis/obstruction-  essentially a solitary kidney as well as HTN.  Renal US without persistent hydronephrosis but did reveal atrophic right kidney and some mild fullness of the collecting system without significant hydronephrosis.  His Scr has improved some with IVF's - unclear what new baseline will be.  He is having ongoing nausea but given his BUN of only 30 I do not feel these are uremic symptoms.  No indication for HD at this time.  Continue with IVF's and follow renal function.     N/V/D - diarrhea has improved, however his nausea and RUQ abdominal pain have worsened.    gallbladder with RUQ [place] looks OK.   Nephrolithiasis - has not seen a Urologist in 12 years.  Likely nothing to do -  right kidney is not functional Hypertensive urgency - improved Bp with meds. Norvasc 10 and hydralazine 50. Hold off on ACE/ARB for now given AKI. Metabolic acidosis - likely due to his AKI/CKD.  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

## 2022-11-10 NOTE — Progress Notes (Signed)
PROGRESS NOTE    Patient: Manuel Riggs                            PCP: Patient, No Pcp Per                    DOB: 05-25-1990            DOA: 11/07/2022 ZHY:[id number]             DOS: 11/10/2022, 5:37 PM   LOS: 2 days   Date of Service: The patient was seen and examined on 11/10/2022  Subjective:   Resting comfortably -Voiding okay  No Nausea, Vomiting or Diarrhea  Brief Narrative:    Manuel Riggs is a 32 y.o. male with medical history significant of nephrolithiasis, tobacco use disorder, and more presents the ED with a chief complaint of nausea.  Patient reports that he woke up at 6 AM Sunday morning without a problem.  He went back to sleep after he used the bathroom, got up at 9 AM and felt nauseous.  Reports he had associated headache.  He had vomiting x 1 that was nonbloody.  He reports fatigue and malaise.  He denies abdominal pain at first but then reports he has had left upper quadrant pain that he refers to his chest pain.  He denies flank pain.  He reports feeling feverish and having cold sweats Sunday a.m.  He denies any dysuria or hematuria.  Patient reports his left upper quadrant pain started at 10 PM.  He has been avoiding NSAIDs as he was advised to do so last time he had renal problems.    He does smoke 5-10 cigarettes/day.  He does not drink.  He smokes marijuana and his last use was 11/07/2022.    ED Temp 98.2, heart rate 75-94, respiratory rate 14-22, blood pressure 171/109-188/127, satting 97% No leukocytosis with a white blood cell count of 8.3, hemoglobin 13.9, platelets 215 Chemistry is unremarkable aside from a creatinine of 4.20 Urine is positive for THC UA shows proteinuria consistent with AKI and hypertensive crisis Chest x-ray shows no active disease EKG shows a heart rate of 88, normal sinus rhythm, QTc 420 Patient was given 2 L bolus Patient was given Zofran which did not help Phenergan was added to his regimen Admission was requested for  AKI    Assessment & Plan:   Principal Problem:   AKI (acute kidney injury) (HCC) Active Problems:   Tobacco use disorder   Hypertensive crisis   Metabolic acidosis     Assessment and Plan: 1)AKI (acute kidney injury) with metabolic acidosis - No recent creatinine available for comparison  -Creatinine on admission 4.20 -Creatinine currently down to 3.78 - History of staghorn calculus with nephrostomy tubes required back in 2012 per patient report -history of obstructive nephrolithiasis - Denies taking NSAIDs -- Ultrasound of kidneys -cystic lesion-atrophy right kidney -negative for hydronephrosis -Nephrology consult appreciated continue IV fluids and bicarb  2)Hypertensive Crisis - Blood pressure as high as 188/127 >> 160/102 >>  - c/n  Norvasc 10 mg daily (started this admission) -Continue hydralazine 50 mg 3 times daily -Add isosorbide 30 mg daily for better BP control - HTN associated AKI -therefore avoiding ACE/ARB's - Chest x-ray shows no acute disease - EKG is without ischemic changes  3)Tobacco use disorder - Smokes 6 to 10 cigarettes/day - Declines nicotine patch at this time - Counseled on importance of cessation   ----------------------------------------------------------------------------------------------------------------------------------  Nutritional status:  The patient's BMI is: Body mass index is 26.53 kg/m. I agree with the assessment and plan as outlined ------------------------------------------------------------------------------------------------------------------------------------  DVT prophylaxis:  heparin injection 5,000 Units Start: 11/08/22 0600 SCDs Start: 11/08/22 0128   Code Status:   Code Status: Full Code  Family Communication: No family member present at bedside-    Admission status:   Status is: Inpatient Remains inpatient appropriate because: Needing close monitor, titration of medication for better blood pressure control,  IV fluids for acute renal failure   Disposition: From  - home             Planning for discharge in 1-2 days: to HOME    Procedures:   No admission procedures for hospital encounter.   Antimicrobials:  Anti-infectives (From admission, onward)    None        Medication:   amLODipine  10 mg Oral Daily   heparin  5,000 Units Subcutaneous Q8H   hydrALAZINE  50 mg Oral TID   Ensure Max Protein  11 oz Oral Daily   sodium bicarbonate  650 mg Oral BID    acetaminophen **OR** acetaminophen, hydrALAZINE, HYDROmorphone (DILAUDID) injection, ondansetron **OR** ondansetron (ZOFRAN) IV, oxyCODONE, promethazine (PHENERGAN) injection (IM or IVPB)   Objective:   Vitals:   11/10/22 0559 11/10/22 0723 11/10/22 1347 11/10/22 1556  BP: (!) 143/90 (!) 140/96 134/83 (!) 158/99  Pulse:  80 (!) 102   Resp:      Temp:  99.5 F (37.5 C) 98.9 F (37.2 C)   TempSrc:   Oral   SpO2:  98% 97%   Weight:      Height:        Intake/Output Summary (Last 24 hours) at 11/10/2022 1737 Last data filed at 11/10/2022 1300 Gross per 24 hour  Intake 1200 ml  Output 2250 ml  Net -1050 ml   Filed Weights   11/07/22 2303 11/07/22 2325  Weight: 85.7 kg 74.6 kg    Physical Exam Gen:- Awake Alert, in no acute distress  HEENT:- .AT, No sclera icterus Neck-Supple Neck,No JVD,.  Lungs-  CTAB , fair air movement bilaterally  CV- S1, S2 normal, RRR Abd-  +ve B.Sounds, Abd Soft, No tenderness, no CVA Tenderness    Extremity/Skin:- No  edema,   good pedal pulses  Psych-affect is appropriate, oriented x3 Neuro-no new focal deficits, no tremors   LABs:     Latest Ref Rng & Units 11/10/2022    7:01 AM 11/08/2022    5:08 AM 11/07/2022   11:55 PM  CBC  WBC 4.0 - 10.5 K/uL 6.1  6.5  8.3   Hemoglobin 13.0 - 17.0 g/dL 98.1  19.1  47.8   Hematocrit 39.0 - 52.0 % 39.3  39.5  42.1   Platelets 150 - 400 K/uL 199  183  215       Latest Ref Rng & Units 11/10/2022    4:22 AM 11/09/2022    4:16 AM 11/08/2022     5:08 AM  CMP  Glucose 70 - 99 mg/dL 84  78  88   BUN 6 - 20 mg/dL 29  30  28    Creatinine 0.61 - 1.24 mg/dL 2.95  6.21  3.08   Sodium 135 - 145 mmol/L 138  138  137   Potassium 3.5 - 5.1 mmol/L 4.3  4.6  4.5   Chloride 98 - 111 mmol/L 109  108  110   CO2 22 - 32 mmol/L 19  18  18  Calcium 8.9 - 10.3 mg/dL 8.3  8.4  8.4   Total Protein 6.5 - 8.1 g/dL   6.7   Total Bilirubin 0.3 - 1.2 mg/dL   0.9   Alkaline Phos 38 - 126 U/L   108   AST 15 - 41 U/L   14   ALT 0 - 44 U/L   16     SIGNED: Shon Hale, MD, - [facility],  Pager (please use [email or web address] to page/ text) Please use Epic Secure Chat for non-urgent communication (7AM-7PM)  If 7PM-7AM, please contact night-coverage [email or web address], 11/10/2022, 5:37 PM

## 2022-11-10 NOTE — Progress Notes (Signed)
Patient is alert and oriented x4. Ambulates to the bathroom independently. Good urine output. Patient has been nauseous several times, had one episode of emesis. Patient given Zofran and Promethazine. Patient has c/o pain throughout the night, mostly rates it a 5 or 7. RUQ/chest area. PRN Dilaudid given twice. Nausea and pain meds don't seem to be giving him much relief. Patient appears to be very uncomfortable.

## 2022-11-11 ENCOUNTER — Other Ambulatory Visit: Payer: Self-pay

## 2022-11-11 ENCOUNTER — Inpatient Hospital Stay (HOSPITAL_COMMUNITY)
Admission: EM | Admit: 2022-11-11 | Discharge: 2022-11-16 | DRG: 314 | Disposition: A | Discharge status: Home / Self Care | Payer: 59 | Attending: Internal Medicine | Admitting: Internal Medicine

## 2022-11-11 ENCOUNTER — Emergency Department (HOSPITAL_COMMUNITY): Payer: 59

## 2022-11-11 ENCOUNTER — Encounter (HOSPITAL_COMMUNITY): Payer: Self-pay | Admitting: Emergency Medicine

## 2022-11-11 ENCOUNTER — Encounter (HOSPITAL_COMMUNITY): Payer: Self-pay | Admitting: Family Medicine

## 2022-11-11 DIAGNOSIS — M549 Dorsalgia, unspecified: Secondary | ICD-10-CM | POA: Diagnosis present

## 2022-11-11 DIAGNOSIS — R0781 Pleurodynia: Secondary | ICD-10-CM | POA: Diagnosis not present

## 2022-11-11 DIAGNOSIS — Z1152 Encounter for screening for COVID-19: Secondary | ICD-10-CM

## 2022-11-11 DIAGNOSIS — Z79899 Other long term (current) drug therapy: Secondary | ICD-10-CM

## 2022-11-11 DIAGNOSIS — I319 Disease of pericardium, unspecified: Secondary | ICD-10-CM

## 2022-11-11 DIAGNOSIS — E872 Acidosis, unspecified: Secondary | ICD-10-CM | POA: Diagnosis present

## 2022-11-11 DIAGNOSIS — F1210 Cannabis abuse, uncomplicated: Secondary | ICD-10-CM | POA: Diagnosis present

## 2022-11-11 DIAGNOSIS — R0789 Other chest pain: Secondary | ICD-10-CM | POA: Diagnosis not present

## 2022-11-11 DIAGNOSIS — N184 Chronic kidney disease, stage 4 (severe): Secondary | ICD-10-CM | POA: Diagnosis present

## 2022-11-11 DIAGNOSIS — I300 Acute nonspecific idiopathic pericarditis: Secondary | ICD-10-CM | POA: Diagnosis not present

## 2022-11-11 DIAGNOSIS — Z8249 Family history of ischemic heart disease and other diseases of the circulatory system: Secondary | ICD-10-CM

## 2022-11-11 DIAGNOSIS — R079 Chest pain, unspecified: Secondary | ICD-10-CM | POA: Diagnosis not present

## 2022-11-11 DIAGNOSIS — J069 Acute upper respiratory infection, unspecified: Secondary | ICD-10-CM | POA: Diagnosis present

## 2022-11-11 DIAGNOSIS — K859 Acute pancreatitis without necrosis or infection, unspecified: Secondary | ICD-10-CM | POA: Diagnosis present

## 2022-11-11 DIAGNOSIS — R9431 Abnormal electrocardiogram [ECG] [EKG]: Secondary | ICD-10-CM

## 2022-11-11 DIAGNOSIS — F17210 Nicotine dependence, cigarettes, uncomplicated: Secondary | ICD-10-CM | POA: Diagnosis present

## 2022-11-11 DIAGNOSIS — I2510 Atherosclerotic heart disease of native coronary artery without angina pectoris: Secondary | ICD-10-CM | POA: Diagnosis present

## 2022-11-11 DIAGNOSIS — B9789 Other viral agents as the cause of diseases classified elsewhere: Secondary | ICD-10-CM | POA: Diagnosis present

## 2022-11-11 DIAGNOSIS — N179 Acute kidney failure, unspecified: Secondary | ICD-10-CM | POA: Diagnosis not present

## 2022-11-11 DIAGNOSIS — G8929 Other chronic pain: Secondary | ICD-10-CM | POA: Diagnosis present

## 2022-11-11 DIAGNOSIS — Z743 Need for continuous supervision: Secondary | ICD-10-CM | POA: Diagnosis not present

## 2022-11-11 DIAGNOSIS — R071 Chest pain on breathing: Secondary | ICD-10-CM

## 2022-11-11 DIAGNOSIS — R0602 Shortness of breath: Secondary | ICD-10-CM | POA: Diagnosis not present

## 2022-11-11 DIAGNOSIS — I10 Essential (primary) hypertension: Secondary | ICD-10-CM | POA: Diagnosis not present

## 2022-11-11 DIAGNOSIS — R000 Tachycardia, unspecified: Secondary | ICD-10-CM | POA: Diagnosis not present

## 2022-11-11 DIAGNOSIS — I129 Hypertensive chronic kidney disease with stage 1 through stage 4 chronic kidney disease, or unspecified chronic kidney disease: Secondary | ICD-10-CM | POA: Diagnosis present

## 2022-11-11 HISTORY — DX: Disease of pericardium, unspecified: I31.9

## 2022-11-11 LAB — RENAL FUNCTION PANEL
Albumin: 3.2 g/dL — ABNORMAL LOW (ref 3.5–5.0)
Anion gap: 11 (ref 5–15)
BUN: 27 mg/dL — ABNORMAL HIGH (ref 6–20)
CO2: 19 mmol/L — ABNORMAL LOW (ref 22–32)
Calcium: 8.5 mg/dL — ABNORMAL LOW (ref 8.9–10.3)
Chloride: 110 mmol/L (ref 98–111)
Creatinine, Ser: 3.61 mg/dL — ABNORMAL HIGH (ref 0.61–1.24)
GFR, Estimated: 22 mL/min — ABNORMAL LOW (ref >60)
Glucose, Bld: 94 mg/dL (ref 70–99)
Phosphorus: 3 mg/dL (ref 2.5–4.6)
Potassium: 4 mmol/L (ref 3.5–5.1)
Sodium: 140 mmol/L (ref 135–145)

## 2022-11-11 LAB — CBC
HCT: 38 % — ABNORMAL LOW (ref 39.0–52.0)
Hemoglobin: 12.6 g/dL — ABNORMAL LOW (ref 13.0–17.0)
MCH: 29.9 pg (ref 26.0–34.0)
MCHC: 33.2 g/dL (ref 30.0–36.0)
MCV: 90 fL (ref 80.0–100.0)
Platelets: 217 10*3/uL (ref 150–400)
RBC: 4.22 MIL/uL (ref 4.22–5.81)
RDW: 14.2 % (ref 11.5–15.5)
WBC: 7.7 10*3/uL (ref 4.0–10.5)
nRBC: 0 % (ref 0.0–0.2)

## 2022-11-11 LAB — BASIC METABOLIC PANEL
Anion gap: 9 (ref 5–15)
BUN: 28 mg/dL — ABNORMAL HIGH (ref 6–20)
CO2: 20 mmol/L — ABNORMAL LOW (ref 22–32)
Calcium: 8.6 mg/dL — ABNORMAL LOW (ref 8.9–10.3)
Chloride: 108 mmol/L (ref 98–111)
Creatinine, Ser: 3.66 mg/dL — ABNORMAL HIGH (ref 0.61–1.24)
GFR, Estimated: 21 mL/min — ABNORMAL LOW (ref >60)
Glucose, Bld: 160 mg/dL — ABNORMAL HIGH (ref 70–99)
Potassium: 3.6 mmol/L (ref 3.5–5.1)
Sodium: 137 mmol/L (ref 135–145)

## 2022-11-11 LAB — TROPONIN I (HIGH SENSITIVITY)
Troponin I (High Sensitivity): 29 ng/L — ABNORMAL HIGH (ref <18)
Troponin I (High Sensitivity): 46 ng/L — ABNORMAL HIGH (ref <18)

## 2022-11-11 LAB — BRAIN NATRIURETIC PEPTIDE: B Natriuretic Peptide: 68 pg/mL (ref 0.0–100.0)

## 2022-11-11 LAB — PARATHYROID HORMONE, INTACT (NO CA): PTH: 94 pg/mL — ABNORMAL HIGH (ref 15–65)

## 2022-11-11 MED ORDER — ISOSORBIDE MONONITRATE ER 30 MG PO TB24: 30.0000 mg | ORAL_TABLET | Freq: Every day | ORAL | 3 refills | Status: DC

## 2022-11-11 MED ORDER — TECHNETIUM TO 99M ALBUMIN AGGREGATED
4.0000 | Freq: Once | INTRAVENOUS | Status: AC | PRN
  Administered 2022-11-11: 4.4 via INTRAVENOUS

## 2022-11-11 MED ORDER — ONDANSETRON HCL 4 MG PO TABS: 4.0000 mg | ORAL_TABLET | Freq: Three times a day (TID) | ORAL | 1 refills | Status: AC | PRN

## 2022-11-11 MED ORDER — METHOCARBAMOL 750 MG PO TABS: 750.0000 mg | ORAL_TABLET | Freq: Three times a day (TID) | ORAL | 0 refills | Status: DC

## 2022-11-11 MED ORDER — HYDRALAZINE HCL 25 MG PO TABS
50.0000 mg | ORAL_TABLET | Freq: Two times a day (BID) | ORAL | Status: DC
  Administered 2022-11-11: 50 mg via ORAL

## 2022-11-11 MED ORDER — SODIUM BICARBONATE 650 MG PO TABS: 650.0000 mg | ORAL_TABLET | Freq: Two times a day (BID) | ORAL | 5 refills | Status: DC

## 2022-11-11 MED ORDER — ACETAMINOPHEN 325 MG PO TABS: 650.0000 mg | ORAL_TABLET | Freq: Four times a day (QID) | ORAL | 0 refills | Status: AC | PRN

## 2022-11-11 MED ORDER — HYDRALAZINE HCL 50 MG PO TABS: 50.0000 mg | ORAL_TABLET | Freq: Three times a day (TID) | ORAL | 3 refills | Status: DC

## 2022-11-11 MED ORDER — AMLODIPINE BESYLATE 10 MG PO TABS: 10.0000 mg | ORAL_TABLET | Freq: Every day | ORAL | 3 refills | Status: DC

## 2022-11-11 NOTE — ED Provider Notes (Signed)
[place] EMERGENCY DEPARTMENT AT [facility] Provider Note   CSN: [id number] Arrival date & time: 11/11/22  1640     History  Chief Complaint  Patient presents with   Chest Pain    Manuel Riggs is a 32 y.o. male.  He recently diagnosed with renal failure and had a hospitalization and was just discharged today, states he was feeling well when he went home but had taken a muscle relaxer he was prescribed, states shortly after that he started having severe chest pain, fast heart rate and passed out.  Does not retain the ground, no head injury or loss of consciousness no nausea or vomiting but still having chest pain and shortness of breath   Chest Pain      Home Medications Prior to Admission medications   Medication Sig Start Date End Date Taking? Authorizing Provider  acetaminophen (TYLENOL) 325 MG tablet Take 2 tablets (650 mg total) by mouth every 6 (six) hours as needed for mild pain (or Fever >/= 101). 11/11/22   Shon Hale, MD  amLODipine (NORVASC) 10 MG tablet Take 1 tablet (10 mg total) by mouth daily. 11/12/22   Shon Hale, MD  hydrALAZINE (APRESOLINE) 50 MG tablet Take 1 tablet (50 mg total) by mouth 3 (three) times daily. 11/11/22   Shon Hale, MD  isosorbide mononitrate (IMDUR) 30 MG 24 hr tablet Take 1 tablet (30 mg total) by mouth daily. 11/12/22   Shon Hale, MD  methocarbamol (ROBAXIN) 750 MG tablet Take 1 tablet (750 mg total) by mouth 3 (three) times daily. 11/11/22   Shon Hale, MD  ondansetron (ZOFRAN) 4 MG tablet Take 1 tablet (4 mg total) by mouth every 8 (eight) hours as needed for nausea or vomiting. 11/11/22 11/11/23  Shon Hale, MD  sodium bicarbonate 650 MG tablet Take 1 tablet (650 mg total) by mouth 2 (two) times daily. 11/11/22   Shon Hale, MD      Allergies    Aspirin    Review of Systems   Review of Systems  Cardiovascular:  Positive for chest pain.    Physical Exam Updated Vital Signs BP (!)  140/84   Pulse (!) 121   Temp 98.2 F (36.8 C) (Oral)   Resp (!) 25   SpO2 92%  Physical Exam Vitals and nursing note reviewed.  Constitutional:      General: He is not in acute distress.    Appearance: He is well-developed.  HENT:     Head: Normocephalic and atraumatic.  Eyes:     Conjunctiva/sclera: Conjunctivae normal.  Cardiovascular:     Rate and Rhythm: Regular rhythm. Tachycardia present.     Heart sounds: No murmur heard. Pulmonary:     Effort: Pulmonary effort is normal. No respiratory distress.     Breath sounds: Normal breath sounds.  Abdominal:     Palpations: Abdomen is soft.     Tenderness: There is no abdominal tenderness.  Musculoskeletal:        General: No swelling.     Cervical back: Neck supple.     Right lower leg: No tenderness. No edema.     Left lower leg: No tenderness. No edema.  Skin:    General: Skin is warm and dry.     Capillary Refill: Capillary refill takes less than 2 seconds.  Neurological:     Mental Status: He is alert.  Psychiatric:        Mood and Affect: Mood normal.     ED  Results / Procedures / Treatments   Labs (all labs ordered are listed, but only abnormal results are displayed) Labs Reviewed  CBC - Abnormal; Notable for the following components:      Result Value   Hemoglobin 12.6 (*)    HCT 38.0 (*)    All other components within normal limits  BASIC METABOLIC PANEL  BRAIN NATRIURETIC PEPTIDE  TROPONIN I (HIGH SENSITIVITY)    EKG None  Radiology DG Chest Port 1 View  Result Date: 11/11/2022 CLINICAL DATA:  Chest pain. EXAM: PORTABLE CHEST 1 VIEW COMPARISON:  November 07, 2022. FINDINGS: The heart size and mediastinal contours are within normal limits. Both lungs are clear. The visualized skeletal structures are unremarkable. IMPRESSION: No active disease. Electronically Signed   By: Lupita Raider M.D.   On: 11/11/2022 18:32    Procedures Procedures    Medications Ordered in ED Medications - No data to  display  ED Course/ Medical Decision Making/ A&P                             Medical Decision Making DDx: ACS, PE, pericarditis, effusion, pneumonia, pneumothorax, other  ED course: Patient presents the ER with sudden onset chest pain and shortness of breath with a syncopal episode.  Was just in the hospital, was admitted on 6/23 and discharged today, went home and had the above symptoms, he had recent immobilization due to hospitalization, having some mild hypoxia with tachycardia chest pain or shortness of breath.  He is high risk for PE, unfortunately he has baseline renal dysfunction so a VQ scan, he is not hypotensive or severely hypoxic.  Will get cardiac biomarkers, basic labs, chest x-ray and plan for V/Q  Out to oncoming team pending further evaluation            Final Clinical Impression(s) / ED Diagnoses Final diagnoses:  None    Rx / DC Orders ED Discharge Orders     None         Josem Kaufmann 11/11/22 1856    Glendora Score, MD 11/12/22 0301

## 2022-11-11 NOTE — TOC Transition Note (Signed)
Transition of Care [facility]) - CM/SW Discharge Note   Patient Details  Name: Manuel Riggs MRN: [id number] Date of Birth: [birth year]  [facility] CM/SW Contact:  Leitha Bleak, RN Phone Number: 11/11/2022, 11:39 AM   Clinical Narrative:   Provided patient with [place] work center number. Patient is having difficulty find a job due to record. CM confirmed INS with registration.    Final next level of care: Home/Self Care Barriers to Discharge: Barriers Resolved   Patient Goals and CMS Choice        Patient and family notified of of transfer: 11/11/22  Discharge Plan and Services Additional resources added to the After Visit Summary for         Social Determinants of Health (SDOH) Interventions SDOH Screenings   Food Insecurity: No Food Insecurity (11/08/2022)  Housing: Low Risk  (11/08/2022)  Transportation Needs: No Transportation Needs (11/08/2022)  Utilities: Not At Risk (11/08/2022)  Tobacco Use: High Risk (11/11/2022)    Readmission Risk Interventions     No data to display

## 2022-11-11 NOTE — ED Notes (Signed)
Pt is back from VQ scan, has urinated 900 cc, A&O x 4.

## 2022-11-11 NOTE — Progress Notes (Addendum)
1.  Pleuritic chest pain 2.  Abnormal EKG 3.  Acute renal failure stage III  Recommendation: Patient's chest pain after physician discussion appears to be clearly muscular skeletal.  EKG reveals sinus tachycardia with short PR interval and new T wave abnormality in the anterior leads.  Patient has no significant cardiovascular risk factors, highly doubt this is ACS.  Would recommend treatment empirically for acute peritonitis with colchicine as we are unable to use NSAIDs, avoid steroids unless necessary, echocardiogram in the morning, no need for IV heparin and repeat 1 more set of troponins in the morning to evaluate the trend.  Unless wall motion abnormality, LV systolic dysfunction, would not recommend ischemic workup. VQ scan negative for PE. Near Syncope probably due to vasovagal episode  Discussed with Carmel Sacramento, PA-C  11-minute telephone encounter and review of charts.

## 2022-11-11 NOTE — ED Provider Notes (Addendum)
   Patient signed out to me pending completion of workup.   Patient here with right-sided chest pain, syncope and shortness of breath.  Discharged from hospital this morning.  Was admitted for AKI.  Tachycardic at home earlier today   See previous provider note for complete H&P  Patient here with pleuritic chest pain, right-sided tachycardic had high clinical concern for PE.  Given his renal status, VQ scan ordered  Scan negative for evidence of PE.  Patient does have slightly elevated troponins.  Will consult with cardiology.  Discussed findings with cardiology, Dr. Anselm Jungling who felt that patient was appropriate for hospital admission here with repeat troponins in the morning and plan for echocardiogram.  If troponins flat, felt that patient would benefit from treatment for pericarditis with colchicine 1.2 mg twice daily  Discussed findings with [facility] hospitalist, Dr. Thomes Dinning who agrees to admit    NM Pulmonary Perfusion  Result Date: 11/11/2022 CLINICAL DATA:  Short of breath, chest pain EXAM: NUCLEAR MEDICINE PERFUSION LUNG SCAN TECHNIQUE: Perfusion images were obtained in multiple projections after intravenous injection of radiopharmaceutical. Ventilation scans intentionally deferred if perfusion scan and chest x-ray adequate for interpretation during COVID 19 epidemic. RADIOPHARMACEUTICALS:  4.4 mCi Tc-37m MAA IV COMPARISON:  11/11/2022 FINDINGS: Planar images of the chest are obtained in multiple projections during the perfusion exam. There are no perfusion defects. Normal radiotracer distribution. IMPRESSION: 1. No evidence of pulmonary embolus.  No perfusion defects. Electronically Signed   By: Sharlet Salina M.D.   On: 11/11/2022 21:01       Pauline Aus, PA-C 11/12/22 0024    Pauline Aus, PA-C 11/12/22 Nonah Mattes, MD 11/12/22 1610

## 2022-11-11 NOTE — Discharge Summary (Signed)
Manuel Riggs, is a 32 y.o. male  DOB July 13, 1990  MRN [id number].  Admission date:  11/07/2022  Admitting Physician  Lilyan Gilford, DO  Discharge Date:  11/11/2022   Primary MD  Patient, No Pcp Per  Recommendations for primary care physician for things to follow:   1)Avoid ibuprofen/Advil/Aleve/Motrin/Goody Powders/Naproxen/BC powders/Meloxicam/Diclofenac/Indomethacin and other Nonsteroidal anti-inflammatory medications as these will make you more likely to bleed and can cause stomach ulcers, can also cause Kidney problems.   2)Follow up with Nephrologist Dr. Annie Sable or Dr Harrie Jeans 11/29/22 At [facility], [address] [place], [place] [address], Phone [phone]  3)Please note that there has been changes to your medications  Admission Diagnosis  AKI (acute kidney injury) (HCC) [N17.9]  Discharge Diagnosis  AKI (acute kidney injury) (HCC) [N17.9]    Principal Problem:   AKI (acute kidney injury) (HCC) Active Problems:   Tobacco use disorder   Hypertensive crisis   Metabolic acidosis     Past Medical History:  Diagnosis Date   Chronic back pain    Chronic leg pain    Kidney calculi     Past Surgical History:  Procedure Laterality Date   KIDNEY STONE SURGERY       HPI  from the history and physical done on the day of admission:   HPI: Manuel Riggs is a 32 y.o. male with medical history significant of nephrolithiasis, tobacco use disorder, and more presents the ED with a chief complaint of nausea.  Patient reports that he woke up at 6 AM Sunday morning without a problem.  He went back to sleep after he used the bathroom, got up at 9 AM and felt nauseous.  Reports he had associated headache.  He had vomiting x 1 that was nonbloody.  He reports fatigue and malaise.  He denies abdominal pain at first but then reports he has had left upper  quadrant pain that he refers to his chest pain.  He denies flank pain.  He reports feeling feverish and having cold sweats Sunday a.m.  He denies any dysuria or hematuria.  Patient reports his left upper quadrant pain started at 10 PM.  It is crampy.  He reports that he had no appetite on Sunday so his last normal meal was on Saturday.  His last normal bowel movement was on Saturday.  Patient has had no changes in his urine frequency or volume.  Patient reports she has been taking plenty of p.o. fluids, and has been avoiding NSAIDs as he was advised to do so last time he had renal problems.  Patient has no other complaints at this time.   He does smoke 5-10 cigarettes/day.  He does not drink.  He smokes marijuana and his last use was 11/07/2022.  He reports he usually takes marijuana for nausea.  It did not help.  Patient reports that he is full code. Review of Systems: As mentioned in the history of present illness. All other systems reviewed and are negative.  Hospital Course:    1)AKI (acute kidney injury) with metabolic acidosis - No recent creatinine available for comparison  -Creatinine on admission 4.20 -Creatinine currently down to 3.61 - History of staghorn calculus with nephrostomy tubes required back in 2012 per patient report -history of obstructive nephrolithiasis - Denies taking NSAIDs -- Ultrasound of kidneys -cystic lesion-atrophy right kidney -negative for hydronephrosis -Nephrology consult appreciated Received IVF, Voiding well No dysuria or hematuria -Repeat BMP with Nephrologist on 11/29/22   2)Hypertensive Crisis - Blood pressure as high as 188/127 >> 160/102 >>  - c/n  Norvasc 10 mg daily (started this admission) -Continue hydralazine 50 mg 3 times daily --c/n Isosorbide 30 mg daily for better BP control - HTN associated AKI -therefore avoiding ACE/ARB's, will use Imdur/Hydralazine combo in of ACEI/ARB/ARNI - Chest x-ray shows no acute disease - EKG is without ischemic  changes   3)Tobacco use disorder - Smokes 6 to 10 cigarettes/day - Declines nicotine patch at this time - Counseled on importance of cessation  Discharge Condition: stable  Follow UP   Follow-up Information     Annie Sable, MD. Schedule an appointment as soon as possible for a visit on 11/29/2022.   Specialty: Nephrology Why: Repeat BMP Contact information: [facility] [phone]                  Consults obtained - Nephrology  Diet and Activity recommendation:  As advised  Discharge Instructions    Discharge Instructions     Call MD for:  difficulty breathing, headache or visual disturbances   Complete by: As directed    Call MD for:  persistant dizziness or light-headedness   Complete by: As directed    Call MD for:  persistant nausea and vomiting   Complete by: As directed    Call MD for:  temperature >100.4   Complete by: As directed    Diet - low sodium heart healthy   Complete by: As directed    Discharge instructions   Complete by: As directed    1)Avoid ibuprofen/Advil/Aleve/Motrin/Goody Powders/Naproxen/BC powders/Meloxicam/Diclofenac/Indomethacin and other Nonsteroidal anti-inflammatory medications as these will make you more likely to bleed and can cause stomach ulcers, can also cause Kidney problems.   2)Follow up with Nephrologist Dr. Annie Sable or Dr Harrie Jeans 11/29/22 At [facility], [address] [place], [place] [address], Phone [phone]  3) please note that there has been changes to your medications   Increase activity slowly   Complete by: As directed        Discharge Medications    Allergies as of 11/11/2022       Reactions   Aspirin    Break out        Medication List     STOP taking these medications    oxyCODONE-acetaminophen 5-325 MG tablet Commonly known as: PERCOCET/ROXICET   tiopronin 100 MG tablet Commonly known as: THIOLA       TAKE  these medications    acetaminophen 325 MG tablet Commonly known as: TYLENOL Take 2 tablets (650 mg total) by mouth every 6 (six) hours as needed for mild pain (or Fever >/= 101).   amLODipine 10 MG tablet Commonly known as: NORVASC Take 1 tablet (10 mg total) by mouth daily. Start taking on: November 12, 2022   hydrALAZINE 50 MG tablet Commonly known as: APRESOLINE Take 1 tablet (50 mg total) by mouth 3 (three) times daily.   isosorbide mononitrate 30 MG 24 hr tablet Commonly known  as: IMDUR Take 1 tablet (30 mg total) by mouth daily. Start taking on: November 12, 2022   methocarbamol 750 MG tablet Commonly known as: ROBAXIN Take 1 tablet (750 mg total) by mouth 3 (three) times daily. What changed:  medication strength how much to take   ondansetron 4 MG tablet Commonly known as: Zofran Take 1 tablet (4 mg total) by mouth every 8 (eight) hours as needed for nausea or vomiting.   sodium bicarbonate 650 MG tablet Take 1 tablet (650 mg total) by mouth 2 (two) times daily.       Major procedures and Radiology Reports - PLEASE review detailed and final reports for all details, in brief -   CT ABDOMEN PELVIS WO CONTRAST  Result Date: 11/10/2022 CLINICAL DATA:  Intermittent right flank pain for several months. History of kidney stones. Acute kidney failure, hypertension. EXAM: CT ABDOMEN AND PELVIS WITHOUT CONTRAST TECHNIQUE: Multidetector CT imaging of the abdomen and pelvis was performed following the standard protocol without IV contrast. RADIATION DOSE REDUCTION: This exam was performed according to the departmental dose-optimization program which includes automated exposure control, adjustment of the mA and/or kV according to patient size and/or use of iterative reconstruction technique. COMPARISON:  Abdominopelvic CT 09/16/2019. Renal ultrasound 11/08/2022. FINDINGS: Lower chest: Minimal subpleural linear atelectasis or scarring at both lung bases. The lung bases are otherwise clear.  No significant pleural or pericardial effusion. Hepatobiliary: The liver appears unremarkable as imaged in the noncontrast state. No evidence of gallstones, gallbladder wall thickening or biliary dilatation. Pancreas: Unremarkable. No pancreatic ductal dilatation or surrounding inflammatory changes. Spleen: Normal in size without focal abnormality. Adrenals/Urinary Tract: Both adrenal glands appear normal. No evidence of urinary tract calculus, suspicious renal lesion or hydronephrosis. A simple appearing cyst in the interpolar region of the left kidney has mildly enlarged in the interval, measuring 5.3 cm on image 32/2. There is an additional cyst in the upper pole the left kidney. These renal lesions were seen on ultrasound and require no additional follow-up imaging. As seen on the ultrasound, there is progressive atrophy and cortical thinning of the right kidney. The bladder appears unremarkable for its degree of distention. Stomach/Bowel: No enteric contrast administered. The stomach appears unremarkable for its degree of distension. No evidence of bowel wall thickening, distention or surrounding inflammatory change. Interval appendectomy. Vascular/Lymphatic: There are no enlarged abdominal or pelvic lymph nodes. Minimal iliac atherosclerosis. No evidence of aneurysm. Reproductive: The prostate gland and seminal vesicles appear unchanged. Other: No evidence of abdominal wall mass or hernia. No ascites or pneumoperitoneum. Subcutaneous edema within the anterior abdominal wall attributed to subcutaneous injections. Musculoskeletal: No acute or significant osseous findings. IMPRESSION: 1. No acute findings or explanation for the patient's symptoms. No evidence of urinary tract calculus or hydronephrosis. 2. Progressive atrophy and cortical thinning of the right kidney. 3. Interval appendectomy. Electronically Signed   By: Carey Bullocks M.D.   On: 11/10/2022 13:15   US Abdomen Limited RUQ (LIVER/GB)  Result  Date: 11/09/2022 CLINICAL DATA:  Right upper quadrant pain and nausea for 3 days EXAM: ULTRASOUND ABDOMEN LIMITED RIGHT UPPER QUADRANT COMPARISON:  Renal ultrasound 11/08/2022.  CT scan 09/16/2019 FINDINGS: Gallbladder: No gallstones or wall thickening visualized. No sonographic Murphy sign noted by sonographer. Common bile duct: Diameter: 3 mm Liver: No focal lesion identified. Within normal limits in parenchymal echogenicity. Portal vein is patent on color Doppler imaging with normal direction of blood flow towards the liver. Other: None. IMPRESSION: No gallstones or ductal dilatation. Electronically Signed  By: Karen Kays M.D.   On: 11/09/2022 13:57   US RENAL  Result Date: 11/08/2022 CLINICAL DATA:  Acute kidney injury. History of bilateral renal calculi and previous bilateral hydronephrosis. Status post previous bilateral percutaneous nephrolithotomy in 2012 at [facility]. EXAM: RENAL / URINARY TRACT ULTRASOUND COMPLETE COMPARISON:  CT of the abdomen and pelvis without contrast on 09/21/2010 FINDINGS: Right Kidney: Renal measurements: 8.5 x 5.4 x 3.9 cm = volume: 93 mL. Cortical atrophy. Mild fullness of the collecting system without significant hydronephrosis. Left Kidney: Renal measurements: 12.5 x 6.5 x 5.6 cm = volume: 237 mL. No hydronephrosis. Simple cyst of the mid left renal cortex measures up to 5 cm. This is consistent with a Bosniak 1 cyst and requires no follow-up. Bladder: Appears normal for degree of bladder distention. Other: None. IMPRESSION: 1. Atrophic right kidney with mild fullness of the collecting system but no significant hydronephrosis. 2. 5 cm simple cyst of the mid left renal cortex. No evidence of left-sided hydronephrosis. Electronically Signed   By: Irish Lack M.D.   On: 11/08/2022 10:49   DG Chest Portable 1 View  Result Date: 11/07/2022 CLINICAL DATA:  Cough EXAM: PORTABLE CHEST 1 VIEW COMPARISON:  Radiographs 09/26/2005 FINDINGS: Stable cardiomediastinal  silhouette. No focal consolidation, pleural effusion, or pneumothorax. No displaced rib fractures. IMPRESSION: No active disease. Electronically Signed   By: Minerva Fester M.D.   On: 11/07/2022 23:42    Today   Subjective    Manuel Riggs today has no new complaints -Grandmother at bedside No fever  Or chills  -Nausea and right flank pain improving   Patient has been seen and examined prior to discharge   Objective   Blood pressure 129/89, pulse 75, temperature 98.5 F (36.9 C), temperature source Oral, resp. rate 15, height 5\' 6"  (1.676 m), weight 74.6 kg, SpO2 98 %.   Intake/Output Summary (Last 24 hours) at 11/11/2022 1136 Last data filed at 11/11/2022 0901 Gross per 24 hour  Intake 1000 ml  Output 1950 ml  Net -950 ml    Exam Gen:- Awake Alert, no acute distress  HEENT:- [place].AT, No sclera icterus Neck-Supple Neck,No JVD,.  Lungs-  CTAB , good air movement bilaterally CV- S1, S2 normal, regular Abd-  +ve B.Sounds, Abd Soft, No tenderness, no significant CVA area tenderness Extremity/Skin:- No  edema,   good pulses Psych-affect is appropriate, oriented x3 Neuro-no new focal deficits, no tremors    Data Review   CBC w Diff:  Lab Results  Component Value Date   WBC 6.1 11/10/2022   HGB 12.7 (L) 11/10/2022   HCT 39.3 11/10/2022   PLT 199 11/10/2022   LYMPHOPCT 16 11/10/2022   MONOPCT 10 11/10/2022   EOSPCT 3 11/10/2022   BASOPCT 1 11/10/2022    CMP:  Lab Results  Component Value Date   NA 140 11/11/2022   K 4.0 11/11/2022   CL 110 11/11/2022   CO2 19 (L) 11/11/2022   BUN 27 (H) 11/11/2022   CREATININE 3.61 (H) 11/11/2022   PROT 6.7 11/08/2022   ALBUMIN 3.2 (L) 11/11/2022   BILITOT 0.9 11/08/2022   ALKPHOS 108 11/08/2022   AST 14 (L) 11/08/2022   ALT 16 11/08/2022  .  Total Discharge time is about 33 minutes  Shon Hale M.D on 11/11/2022 at 11:36 AM  Go to [email or web address] -  for contact info  [facility] - Office  [phone]

## 2022-11-11 NOTE — Progress Notes (Signed)
Patient ID: Manuel Riggs, male   DOB: [birth year], 32 y.o.   MRN: [id number]  S: great UOP-  crt down a little again-   CT negative for specific cause of nausea-  BP is great.  Now is saying has right flank pain and that is the issue-  was able to eat breakfast today but nausea after    O:BP 129/89 (BP Location: Left Arm)   Pulse 75   Temp 98.5 F (36.9 C) (Oral)   Resp 15   Ht 5\' 6"  (1.676 m)   Wt 74.6 kg   SpO2 98%   BMI 26.53 kg/m   Intake/Output Summary (Last 24 hours) at 11/11/2022 0754 Last data filed at 11/11/2022 0500 Gross per 24 hour  Intake 1000 ml  Output 1950 ml  Net -950 ml   Intake/Output: I/O last 3 completed shifts: In: 1720 [P.O.:1680; I.V.:40] Out: 4200 [Urine:4200]  Intake/Output this shift:  No intake/output data recorded. Weight change:  Gen: NAD CVS: RRR Resp:CTA Abd: +BS, soft, mild RUQ/flank tenderness to palpation, no guarding or rebound. Ext: no edema  Recent Labs  Lab 11/07/22 2355 11/08/22 0508 11/09/22 0416 11/10/22 0422 11/11/22 0415  NA 136 137 138 138 140  K 4.4 4.5 4.6 4.3 4.0  CL 106 110 108 109 110  CO2 20* 18* 18* 19* 19*  GLUCOSE 100* 88 78 84 94  BUN 29* 28* 30* 29* 27*  CREATININE 4.20* 3.82* 3.92* 3.78* 3.61*  ALBUMIN 3.9 3.6  --  3.2* 3.2*  CALCIUM 9.0 8.4* 8.4* 8.3* 8.5*  PHOS  --   --   --  3.3 3.0  AST 16 14*  --   --   --   ALT 17 16  --   --   --    Liver Function Tests: Recent Labs  Lab 11/07/22 2355 11/08/22 0508 11/10/22 0422 11/11/22 0415  AST 16 14*  --   --   ALT 17 16  --   --   ALKPHOS 119 108  --   --   BILITOT 1.0 0.9  --   --   PROT 7.3 6.7  --   --   ALBUMIN 3.9 3.6 3.2* 3.2*   Recent Labs  Lab 11/07/22 2355  LIPASE 85*   No results for input(s): "AMMONIA" in the last 168 hours. CBC: Recent Labs  Lab 11/07/22 2355 11/08/22 0508 11/10/22 0701  WBC 8.3 6.5 6.1  NEUTROABS  --  5.2 4.3  HGB 13.9 13.0 12.7*  HCT 42.1 39.5 39.3  MCV 89.4 90.0 90.1  PLT 215 183 199   Cardiac  Enzymes: No results for input(s): "CKTOTAL", "CKMB", "CKMBINDEX", "TROPONINI" in the last 168 hours. CBG: No results for input(s): "GLUCAP" in the last 168 hours.  Iron Studies: No results for input(s): "IRON", "TIBC", "TRANSFERRIN", "FERRITIN" in the last 72 hours. Studies/Results: CT ABDOMEN PELVIS WO CONTRAST  Result Date: 11/10/2022 CLINICAL DATA:  Intermittent right flank pain for several months. History of kidney stones. Acute kidney failure, hypertension. EXAM: CT ABDOMEN AND PELVIS WITHOUT CONTRAST TECHNIQUE: Multidetector CT imaging of the abdomen and pelvis was performed following the standard protocol without IV contrast. RADIATION DOSE REDUCTION: This exam was performed according to the departmental dose-optimization program which includes automated exposure control, adjustment of the mA and/or kV according to patient size and/or use of iterative reconstruction technique. COMPARISON:  Abdominopelvic CT 09/16/2019. Renal ultrasound 11/08/2022. FINDINGS: Lower chest: Minimal subpleural linear atelectasis or scarring at both lung bases. The lung  bases are otherwise clear. No significant pleural or pericardial effusion. Hepatobiliary: The liver appears unremarkable as imaged in the noncontrast state. No evidence of gallstones, gallbladder wall thickening or biliary dilatation. Pancreas: Unremarkable. No pancreatic ductal dilatation or surrounding inflammatory changes. Spleen: Normal in size without focal abnormality. Adrenals/Urinary Tract: Both adrenal glands appear normal. No evidence of urinary tract calculus, suspicious renal lesion or hydronephrosis. A simple appearing cyst in the interpolar region of the left kidney has mildly enlarged in the interval, measuring 5.3 cm on image 32/2. There is an additional cyst in the upper pole the left kidney. These renal lesions were seen on ultrasound and require no additional follow-up imaging. As seen on the ultrasound, there is progressive atrophy and  cortical thinning of the right kidney. The bladder appears unremarkable for its degree of distention. Stomach/Bowel: No enteric contrast administered. The stomach appears unremarkable for its degree of distension. No evidence of bowel wall thickening, distention or surrounding inflammatory change. Interval appendectomy. Vascular/Lymphatic: There are no enlarged abdominal or pelvic lymph nodes. Minimal iliac atherosclerosis. No evidence of aneurysm. Reproductive: The prostate gland and seminal vesicles appear unchanged. Other: No evidence of abdominal wall mass or hernia. No ascites or pneumoperitoneum. Subcutaneous edema within the anterior abdominal wall attributed to subcutaneous injections. Musculoskeletal: No acute or significant osseous findings. IMPRESSION: 1. No acute findings or explanation for the patient's symptoms. No evidence of urinary tract calculus or hydronephrosis. 2. Progressive atrophy and cortical thinning of the right kidney. 3. Interval appendectomy. Electronically Signed   By: Carey Bullocks M.D.   On: 11/10/2022 13:15   US Abdomen Limited RUQ (LIVER/GB)  Result Date: 11/09/2022 CLINICAL DATA:  Right upper quadrant pain and nausea for 3 days EXAM: ULTRASOUND ABDOMEN LIMITED RIGHT UPPER QUADRANT COMPARISON:  Renal ultrasound 11/08/2022.  CT scan 09/16/2019 FINDINGS: Gallbladder: No gallstones or wall thickening visualized. No sonographic Murphy sign noted by sonographer. Common bile duct: Diameter: 3 mm Liver: No focal lesion identified. Within normal limits in parenchymal echogenicity. Portal vein is patent on color Doppler imaging with normal direction of blood flow towards the liver. Other: None. IMPRESSION: No gallstones or ductal dilatation. Electronically Signed   By: Karen Kays M.D.   On: 11/09/2022 13:57    amLODipine  10 mg Oral Daily   heparin  5,000 Units Subcutaneous Q8H   hydrALAZINE  50 mg Oral TID   isosorbide mononitrate  30 mg Oral Daily   Ensure Max Protein  11 oz  Oral Daily   sodium bicarbonate  650 mg Oral BID    BMET    Component Value Date/Time   NA 140 11/11/2022 0415   K 4.0 11/11/2022 0415   CL 110 11/11/2022 0415   CO2 19 (L) 11/11/2022 0415   GLUCOSE 94 11/11/2022 0415   BUN 27 (H) 11/11/2022 0415   CREATININE 3.61 (H) 11/11/2022 0415   CALCIUM 8.5 (L) 11/11/2022 0415   GFRNONAA 22 (L) 11/11/2022 0415   GFRAA (L) 10/01/2010 2041    49        The eGFR has been calculated using the MDRD equation. This calculation has not been validated in all clinical situations. eGFR's persistently <60 mL/min signify possible Chronic Kidney Disease.   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  Now with N/V/D and Scr elevated at 4.2.  UA with blood and protein.  GN workup completely negative even the ESR. , but likely has CKD due to nephrolithiasis/obstruction-  essentially a solitary kidney as well as HTN.  Renal US  did reveal atrophic right kidney and some mild fullness of the collecting system without significant hydronephrosis.  His Scr has improved some with IVF's - unclear what new baseline will be.  He is having ongoing nausea but given his BUN in the 20'sI do not feel these are uremic symptoms.  No indication for HD at this time.   follow renal function-  will hold IVFs.     N/V/D - diarrhea has improved, however his nausea and RUQ abdominal pain have worsened.   gallbladder with RUQ [place] looks OK.  CT negative for etiology of sxms.  Is now  claiming right flank pain-  could the pain be coming from this kidney ?  Would advise urology opinion-  I would say as OP but pt thinks that he wants something done now to help his pain  Nephrolithiasis - has not seen a Urologist in 12 years.  Given kidney  atrophy was thinking Likely nothing to do -  right kidney is not functional-  however, if it is the source of his pain and nausea-  might need to consider some urologic intervention or at least their opinion Hypertensive urgency - improved Bp with meds. Norvasc 10 and hydralazine 50. Hold off on ACE/ARB for now given AKI.  Will change hydral to bid to try to improve compliance Metabolic acidosis - likely due to his AKI/CKD.  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

## 2022-11-11 NOTE — ED Triage Notes (Signed)
Pt from home via [facility] with reports of right side/chest pain. Pt reports he was D/C from the hospital this morning. Pts HR 140 at this time.

## 2022-11-11 NOTE — Progress Notes (Signed)
Nsg Discharge Note  Admit Date:  11/07/2022 Discharge date: 11/11/2022   IFEOLUWA BELLER to be D/C'd Home per MD order.  AVS completed.   Patient/caregiver able to verbalize understanding.  Discharge Medication: Allergies as of 11/11/2022       Reactions   Aspirin    Break out        Medication List     STOP taking these medications    oxyCODONE-acetaminophen 5-325 MG tablet Commonly known as: PERCOCET/ROXICET   tiopronin 100 MG tablet Commonly known as: THIOLA       TAKE these medications    acetaminophen 325 MG tablet Commonly known as: TYLENOL Take 2 tablets (650 mg total) by mouth every 6 (six) hours as needed for mild pain (or Fever >/= 101).   amLODipine 10 MG tablet Commonly known as: NORVASC Take 1 tablet (10 mg total) by mouth daily. Start taking on: November 12, 2022   hydrALAZINE 50 MG tablet Commonly known as: APRESOLINE Take 1 tablet (50 mg total) by mouth 3 (three) times daily.   isosorbide mononitrate 30 MG 24 hr tablet Commonly known as: IMDUR Take 1 tablet (30 mg total) by mouth daily. Start taking on: November 12, 2022   methocarbamol 750 MG tablet Commonly known as: ROBAXIN Take 1 tablet (750 mg total) by mouth 3 (three) times daily. What changed:  medication strength how much to take   ondansetron 4 MG tablet Commonly known as: Zofran Take 1 tablet (4 mg total) by mouth every 8 (eight) hours as needed for nausea or vomiting.   sodium bicarbonate 650 MG tablet Take 1 tablet (650 mg total) by mouth 2 (two) times daily.        Discharge Assessment: Vitals:   11/10/22 2038 11/11/22 0402  BP: (!) 146/89 129/89  Pulse: 97 75  Resp: 19 15  Temp: 98.8 F (37.1 C) 98.5 F (36.9 C)  SpO2: 97% 98%   Skin clean, dry and intact without evidence of skin break down, no evidence of skin tears noted. IV catheter discontinued intact. Site without signs and symptoms of complications - no redness or edema noted at insertion site, patient denies c/o  pain - only slight tenderness at site.  Dressing with slight pressure applied.  D/c Instructions-Education: Discharge instructions given to patient/family with verbalized understanding. D/c education completed with patient/family including follow up instructions, medication list, d/c activities limitations if indicated, with other d/c instructions as indicated by MD - patient able to verbalize understanding, all questions fully answered. Patient instructed to return to ED, call 911, or call MD for any changes in condition.  Patient escorted via WC, and D/C home via private auto.  Laurena Spies, RN 11/11/2022 11:36 AM

## 2022-11-12 ENCOUNTER — Other Ambulatory Visit (HOSPITAL_COMMUNITY): Payer: Self-pay | Admitting: *Deleted

## 2022-11-12 ENCOUNTER — Observation Stay (HOSPITAL_BASED_OUTPATIENT_CLINIC_OR_DEPARTMENT_OTHER): Payer: 59

## 2022-11-12 ENCOUNTER — Encounter (HOSPITAL_COMMUNITY): Payer: Self-pay | Admitting: Internal Medicine

## 2022-11-12 DIAGNOSIS — R079 Chest pain, unspecified: Secondary | ICD-10-CM

## 2022-11-12 DIAGNOSIS — R55 Syncope and collapse: Secondary | ICD-10-CM

## 2022-11-12 DIAGNOSIS — I10 Essential (primary) hypertension: Secondary | ICD-10-CM | POA: Diagnosis not present

## 2022-11-12 DIAGNOSIS — N189 Chronic kidney disease, unspecified: Secondary | ICD-10-CM | POA: Diagnosis not present

## 2022-11-12 DIAGNOSIS — I32 Pericarditis in diseases classified elsewhere: Secondary | ICD-10-CM

## 2022-11-12 DIAGNOSIS — R0789 Other chest pain: Secondary | ICD-10-CM

## 2022-11-12 DIAGNOSIS — R0781 Pleurodynia: Secondary | ICD-10-CM | POA: Diagnosis present

## 2022-11-12 LAB — RESPIRATORY PANEL BY PCR

## 2022-11-12 LAB — ECHOCARDIOGRAM COMPLETE
Area-P 1/2: 3.12 cm2
Height: 66 in
S' Lateral: 2.2 cm
Weight: 2716.07 oz

## 2022-11-12 LAB — COMPREHENSIVE METABOLIC PANEL
ALT: 34 U/L (ref 0–44)
AST: 32 U/L (ref 15–41)
Albumin: 3.5 g/dL (ref 3.5–5.0)
Alkaline Phosphatase: 87 U/L (ref 38–126)
Anion gap: 9 (ref 5–15)
BUN: 25 mg/dL — ABNORMAL HIGH (ref 6–20)
CO2: 20 mmol/L — ABNORMAL LOW (ref 22–32)
Calcium: 8.8 mg/dL — ABNORMAL LOW (ref 8.9–10.3)
Chloride: 112 mmol/L — ABNORMAL HIGH (ref 98–111)
Creatinine, Ser: 3.71 mg/dL — ABNORMAL HIGH (ref 0.61–1.24)
GFR, Estimated: 21 mL/min — ABNORMAL LOW (ref >60)
Glucose, Bld: 86 mg/dL (ref 70–99)
Potassium: 3.9 mmol/L (ref 3.5–5.1)
Sodium: 141 mmol/L (ref 135–145)
Total Bilirubin: 0.6 mg/dL (ref 0.3–1.2)
Total Protein: 7.2 g/dL (ref 6.5–8.1)

## 2022-11-12 LAB — CBC
HCT: 42.8 % (ref 39.0–52.0)
Hemoglobin: 13.8 g/dL (ref 13.0–17.0)
MCH: 29.1 pg (ref 26.0–34.0)
MCHC: 32.2 g/dL (ref 30.0–36.0)
MCV: 90.1 fL (ref 80.0–100.0)
Platelets: 211 10*3/uL (ref 150–400)
RBC: 4.75 MIL/uL (ref 4.22–5.81)
RDW: 14 % (ref 11.5–15.5)
WBC: 5.5 10*3/uL (ref 4.0–10.5)
nRBC: 0 % (ref 0.0–0.2)

## 2022-11-12 LAB — TROPONIN I (HIGH SENSITIVITY)
Troponin I (High Sensitivity): 23 ng/L — ABNORMAL HIGH (ref <18)
Troponin I (High Sensitivity): 20 ng/L — ABNORMAL HIGH (ref <18)

## 2022-11-12 LAB — SARS CORONAVIRUS 2 BY RT PCR: SARS Coronavirus 2 by RT PCR: NEGATIVE

## 2022-11-12 LAB — SEDIMENTATION RATE: Sed Rate: 25 mm/hr — ABNORMAL HIGH (ref 0–16)

## 2022-11-12 LAB — C-REACTIVE PROTEIN: CRP: 0.6 mg/dL (ref <1.0)

## 2022-11-12 MED ORDER — HYDROMORPHONE HCL 1 MG/ML IJ SOLN
0.5000 mg | INTRAMUSCULAR | Status: DC | PRN
  Administered 2022-11-12 – 2022-11-15 (×10): 0.5 mg via INTRAVENOUS
  Filled 2022-11-12 (×10): qty 0.5

## 2022-11-12 MED ORDER — ONDANSETRON HCL 4 MG PO TABS
4.0000 mg | ORAL_TABLET | Freq: Three times a day (TID) | ORAL | Status: DC | PRN
  Administered 2022-11-12 – 2022-11-13 (×2): 4 mg via ORAL
  Filled 2022-11-12 (×2): qty 1

## 2022-11-12 MED ORDER — SODIUM CHLORIDE 0.9% FLUSH: 3.0000 mL | INTRAVENOUS | Status: DC | PRN

## 2022-11-12 MED ORDER — SODIUM CHLORIDE 0.9% FLUSH
3.0000 mL | Freq: Two times a day (BID) | INTRAVENOUS | Status: DC
  Administered 2022-11-12 – 2022-11-16 (×9): 3 mL via INTRAVENOUS

## 2022-11-12 MED ORDER — ACETAMINOPHEN 325 MG PO TABS
650.0000 mg | ORAL_TABLET | Freq: Four times a day (QID) | ORAL | Status: DC | PRN
  Administered 2022-11-13: 650 mg via ORAL
  Filled 2022-11-12: qty 2

## 2022-11-12 MED ORDER — SODIUM CHLORIDE 0.9 % IV SOLN: 250.0000 mL | INTRAVENOUS | Status: DC | PRN

## 2022-11-12 MED ORDER — MORPHINE SULFATE (PF) 2 MG/ML IV SOLN
2.0000 mg | INTRAVENOUS | Status: DC | PRN
  Administered 2022-11-12: 2 mg via INTRAVENOUS
  Filled 2022-11-12: qty 1

## 2022-11-12 MED ORDER — AMLODIPINE BESYLATE 5 MG PO TABS
10.0000 mg | ORAL_TABLET | Freq: Every day | ORAL | Status: DC
  Administered 2022-11-12 – 2022-11-16 (×5): 10 mg via ORAL
  Filled 2022-11-12 (×5): qty 2

## 2022-11-12 MED ORDER — ENSURE MAX PROTEIN PO LIQD
11.0000 [oz_av] | Freq: Every day | ORAL | Status: DC
  Administered 2022-11-12 – 2022-11-16 (×5): 11 [oz_av] via ORAL

## 2022-11-12 MED ORDER — ACETAMINOPHEN 325 MG PO TABS: 650.0000 mg | ORAL_TABLET | Freq: Four times a day (QID) | ORAL | Status: DC | PRN

## 2022-11-12 MED ORDER — HEPARIN SODIUM (PORCINE) 5000 UNIT/ML IJ SOLN
5000.0000 [IU] | Freq: Three times a day (TID) | INTRAMUSCULAR | Status: DC
  Administered 2022-11-12 – 2022-11-15 (×11): 5000 [IU] via SUBCUTANEOUS
  Filled 2022-11-12 (×12): qty 1

## 2022-11-12 MED ORDER — HYDRALAZINE HCL 25 MG PO TABS
50.0000 mg | ORAL_TABLET | Freq: Three times a day (TID) | ORAL | Status: DC
  Administered 2022-11-12 – 2022-11-16 (×12): 50 mg via ORAL
  Filled 2022-11-12 (×14): qty 2

## 2022-11-12 MED ORDER — ACETAMINOPHEN 650 MG RE SUPP: 650.0000 mg | Freq: Four times a day (QID) | RECTAL | Status: DC | PRN

## 2022-11-12 MED ORDER — ISOSORBIDE MONONITRATE ER 60 MG PO TB24
30.0000 mg | ORAL_TABLET | Freq: Every day | ORAL | Status: DC
  Administered 2022-11-12 – 2022-11-16 (×5): 30 mg via ORAL
  Filled 2022-11-12 (×5): qty 1

## 2022-11-12 MED ORDER — SODIUM BICARBONATE 650 MG PO TABS
650.0000 mg | ORAL_TABLET | Freq: Two times a day (BID) | ORAL | Status: DC
  Administered 2022-11-12 – 2022-11-16 (×9): 650 mg via ORAL
  Filled 2022-11-12 (×9): qty 1

## 2022-11-12 MED ORDER — COLCHICINE 0.6 MG PO TABS
0.6000 mg | ORAL_TABLET | Freq: Two times a day (BID) | ORAL | Status: DC
  Administered 2022-11-12 (×2): 0.6 mg via ORAL
  Filled 2022-11-12 (×2): qty 1

## 2022-11-12 NOTE — ED Provider Notes (Incomplete)
   Patient signed out to me pending completion of workup.   Patient here with right-sided chest pain, syncope and shortness of breath.  Discharged from hospital this morning.  Was admitted for AKI.  Tachycardic at home earlier today   See previous provider note for complete H&P  Patient here with pleuritic chest pain, right-sided tachycardic had high clinical concern for PE.  Given his renal status, VQ scan ordered  Scan negative for evidence of PE.  Patient does have slightly elevated troponins.  Will consult with cardiology.  Discussed findings with cardiology, Dr. Anselm Jungling who felt that patient was appropriate for hospital admission here with repeat troponins in the morning and plan for echocardiogram.  If troponins flat, felt that patient would benefit from treatment for pericarditis with colchicine 1.2 mg twice daily       NM Pulmonary Perfusion  Result Date: 11/11/2022 CLINICAL DATA:  Short of breath, chest pain EXAM: NUCLEAR MEDICINE PERFUSION LUNG SCAN TECHNIQUE: Perfusion images were obtained in multiple projections after intravenous injection of radiopharmaceutical. Ventilation scans intentionally deferred if perfusion scan and chest x-ray adequate for interpretation during COVID 19 epidemic. RADIOPHARMACEUTICALS:  4.4 mCi Tc-54m MAA IV COMPARISON:  11/11/2022 FINDINGS: Planar images of the chest are obtained in multiple projections during the perfusion exam. There are no perfusion defects. Normal radiotracer distribution. IMPRESSION: 1. No evidence of pulmonary embolus.  No perfusion defects. Electronically Signed   By: Sharlet Salina M.D.   On: 11/11/2022 21:01

## 2022-11-12 NOTE — Consult Note (Addendum)
Cardiology Consultation   Patient ID: Manuel Riggs MRN: [id number]; DOB: May 08, 1991  Admit date: 11/11/2022 Date of Consult: 11/12/2022  PCP:  Patient, No Pcp Per   [facility] Providers Cardiologist: New to [facility]  Patient Profile:   Manuel Riggs is a 32 y.o. male with a hx of Stage III CKD, prior staghorn calculus requiring nephrostomy tubes in 2012 and HTN who is being seen at the request of Dr. Sherryll Burger for chest pain.   History of Present Illness:   Manuel Riggs was recently admitted to [facility] from 6/23 - 11/11/2022 for hypertensive crisis as BP was at 188/127 upon admission. He was also found to have an AKI as creatinine was elevated at 4.20 on admission and had improved to 3.61 at the time of discharge. Renal ultrasound showed atrophic right kidney with mild fullness of the collecting system but no significant hydronephrosis. CT Abdomen showed no acute abnormalities. He was discharged on Hydralazine 50 mg 3 times daily, Amlodipine 10 mg daily and Imdur 30 mg daily.  He presented back to the ED yesterday evening for evaluation of chest pain. In talking with the patient today, he reports having intermittent chest pain since this past Sunday. Says that he had a cough and sore throat on Saturday but upon waking up Sunday, he did smoke weed and developed chest discomfort afterwards. Reports associated nausea and vomiting at that time and did take activated charcoal with no improvement in his symptoms. Reports his pain has been intermittent since then and waxes and wanes in intensity. Reports it was initially left-sided but is now mostly along his right pectoral region. No association with exertion and not worse with positional changes or deep breathing. No associated dyspnea on exertion, orthopnea or PND. He did have a syncopal episode on the day of admission and reported dizziness and nausea at that time.  Repeat labs showed WBC 7.7, Hgb 12.6, platelets 217, Na+ 137, K+ 3.6 and  creatinine 3.66.  BNP normal at 68. Initial and repeat Hs Troponin values flat at 29, 46, 23 and 20.  CXR with no active disease. VQ scan negative for PE. EKG showing sinus tachycardia, heart rate 119 with LVH and diffuse TWI along the precordial leads.   His case was discussed with cardiology and was felt this could possibly due to pericarditis and was recommend to start colchicine and avoid NSAIDS given his CKD.   Past Medical History:  Diagnosis Date   Chronic back pain    Chronic leg pain    Kidney calculi     Past Surgical History:  Procedure Laterality Date   KIDNEY STONE SURGERY       Home Medications:  Prior to Admission medications   Medication Sig Start Date End Date Taking? Authorizing Provider  acetaminophen (TYLENOL) 325 MG tablet Take 2 tablets (650 mg total) by mouth every 6 (six) hours as needed for mild pain (or Fever >/= 101). 11/11/22   Shon Hale, MD  amLODipine (NORVASC) 10 MG tablet Take 1 tablet (10 mg total) by mouth daily. 11/12/22   Shon Hale, MD  hydrALAZINE (APRESOLINE) 50 MG tablet Take 1 tablet (50 mg total) by mouth 3 (three) times daily. 11/11/22   Shon Hale, MD  isosorbide mononitrate (IMDUR) 30 MG 24 hr tablet Take 1 tablet (30 mg total) by mouth daily. 11/12/22   Shon Hale, MD  methocarbamol (ROBAXIN) 750 MG tablet Take 1 tablet (750 mg total) by mouth 3 (three) times daily. 11/11/22  Shon Hale, MD  ondansetron (ZOFRAN) 4 MG tablet Take 1 tablet (4 mg total) by mouth every 8 (eight) hours as needed for nausea or vomiting. 11/11/22 11/11/23  Shon Hale, MD  sodium bicarbonate 650 MG tablet Take 1 tablet (650 mg total) by mouth 2 (two) times daily. 11/11/22   Shon Hale, MD    Inpatient Medications: Scheduled Meds:  amLODipine  10 mg Oral Daily   colchicine  0.6 mg Oral BID   heparin  5,000 Units Subcutaneous Q8H   hydrALAZINE  50 mg Oral TID   isosorbide mononitrate  30 mg Oral Daily   sodium bicarbonate  650  mg Oral BID   sodium chloride flush  3 mL Intravenous Q12H   Continuous Infusions:  sodium chloride     PRN Meds: sodium chloride, acetaminophen **OR** acetaminophen, HYDROmorphone (DILAUDID) injection, ondansetron, sodium chloride flush  Allergies:    Allergies  Allergen Reactions   Aspirin     Break out    Social History:   Social History   Socioeconomic History   Marital status: Single    Spouse name: Not on file   Number of children: Not on file   Years of education: Not on file   Highest education level: Not on file  Occupational History   Not on file  Tobacco Use   Smoking status: Every Day    Types: Cigarettes   Smokeless tobacco: Not on file  Substance and Sexual Activity   Alcohol use: No   Drug use: Yes    Types: Marijuana   Sexual activity: Not Currently  Other Topics Concern   Not on file  Social History Narrative   Not on file   Social Determinants of Health   Financial Resource Strain: Not on file  Food Insecurity: No Food Insecurity (11/12/2022)   Hunger Vital Sign    Worried About Running Out of Food in the Last Year: Never true    Ran Out of Food in the Last Year: Never true  Transportation Needs: No Transportation Needs (11/12/2022)   PRAPARE - Administrator, Civil Service (Medical): No    Lack of Transportation (Non-Medical): No  Physical Activity: Not on file  Stress: Not on file  Social Connections: Not on file  Intimate Partner Violence: Not At Risk (11/12/2022)   Humiliation, Afraid, Rape, and Kick questionnaire    Fear of Current or Ex-Partner: No    Emotionally Abused: No    Physically Abused: No    Sexually Abused: No    Family History:    Family History  Problem Relation Age of Onset   Hypertension Mother      ROS:  Please see the history of present illness.   All other ROS reviewed and negative.     Physical Exam/Data:   Vitals:   11/12/22 0530 11/12/22 0700 11/12/22 1008 11/12/22 1150  BP: (!) 157/104  (!) 140/114  (!) 168/113  Pulse: 97 97  (!) 101  Resp: 15 18  17   Temp: 98.2 F (36.8 C)  98.1 F (36.7 C) 98.8 F (37.1 C)  TempSrc:   Oral   SpO2: 93% 95%  98%    Intake/Output Summary (Last 24 hours) at 11/12/2022 1225 Last data filed at 11/11/2022 1945 Gross per 24 hour  Intake --  Output 600 ml  Net -600 ml      11/07/2022   11:25 PM 11/07/2022   11:03 PM 01/04/2022    4:33 PM  Last 3 Weights  Weight (lbs) 164 lb 6.4 oz 189 lb 186 lb 4.6 oz  Weight (kg) 74.571 kg 85.73 kg 84.5 kg     There is no height or weight on file to calculate BMI.  General:  Well nourished, well developed male appearing in no acute distress. HEENT: normal Neck: no JVD Vascular: No carotid bruits; Distal pulses 2+ bilaterally Cardiac:  normal S1, S2; RRR; no murmur  Lungs:  clear to auscultation bilaterally, no wheezing, rhonchi or rales  Abd: soft, nontender, no hepatomegaly  Ext: no pitting edema Musculoskeletal:  No deformities, BUE and BLE strength normal and equal Skin: warm and dry  Neuro:  CNs 2-12 intact, no focal abnormalities noted Psych:  Normal affect   EKG:  The EKG was personally reviewed and demonstrates: Sinus tachycardia, heart rate 119 with LVH and diffuse TWI along the precordial leads.   Relevant CV Studies:  Echocardiogram: Pending  Laboratory Data:  High Sensitivity Troponin:   Recent Labs  Lab 11/07/22 2355 11/11/22 1705 11/11/22 1955 11/12/22 0742 11/12/22 0922  TROPONINIHS 15 29* 46* 23* 20*     Chemistry Recent Labs  Lab 11/08/22 0508 11/09/22 0416 11/11/22 0415 11/11/22 1705 11/12/22 0919  NA 137   < > 140 137 141  K 4.5   < > 4.0 3.6 3.9  CL 110   < > 110 108 112*  CO2 18*   < > 19* 20* 20*  GLUCOSE 88   < > 94 160* 86  BUN 28*   < > 27* 28* 25*  CREATININE 3.82*   < > 3.61* 3.66* 3.71*  CALCIUM 8.4*   < > 8.5* 8.6* 8.8*  MG 1.8  --   --   --   --   GFRNONAA 20*   < > 22* 21* 21*  ANIONGAP 9   < > 11 9 9    < > = values in this interval not  displayed.    Recent Labs  Lab 11/07/22 2355 11/08/22 0508 11/10/22 0422 11/11/22 0415 11/12/22 0919  PROT 7.3 6.7  --   --  7.2  ALBUMIN 3.9 3.6 3.2* 3.2* 3.5  AST 16 14*  --   --  32  ALT 17 16  --   --  34  ALKPHOS 119 108  --   --  87  BILITOT 1.0 0.9  --   --  0.6   Lipids No results for input(s): "CHOL", "TRIG", "HDL", "LABVLDL", "LDLCALC", "CHOLHDL" in the last 168 hours.  Hematology Recent Labs  Lab 11/10/22 0701 11/11/22 1705 11/12/22 0919  WBC 6.1 7.7 5.5  RBC 4.36 4.22 4.75  HGB 12.7* 12.6* 13.8  HCT 39.3 38.0* 42.8  MCV 90.1 90.0 90.1  MCH 29.1 29.9 29.1  MCHC 32.3 33.2 32.2  RDW 14.0 14.2 14.0  PLT 199 217 211   Thyroid No results for input(s): "TSH", "FREET4" in the last 168 hours.  BNP Recent Labs  Lab 11/11/22 1705  BNP 68.0    DDimer No results for input(s): "DDIMER" in the last 168 hours.   Radiology/Studies:  NM Pulmonary Perfusion  Result Date: 11/11/2022 CLINICAL DATA:  Short of breath, chest pain EXAM: NUCLEAR MEDICINE PERFUSION LUNG SCAN TECHNIQUE: Perfusion images were obtained in multiple projections after intravenous injection of radiopharmaceutical. Ventilation scans intentionally deferred if perfusion scan and chest x-ray adequate for interpretation during COVID 19 epidemic. RADIOPHARMACEUTICALS:  4.4 mCi Tc-14m MAA IV COMPARISON:  11/11/2022 FINDINGS: Planar images of the chest are obtained in multiple projections  during the perfusion exam. There are no perfusion defects. Normal radiotracer distribution. IMPRESSION: 1. No evidence of pulmonary embolus.  No perfusion defects. Electronically Signed   By: Sharlet Salina M.D.   On: 11/11/2022 21:01   DG Chest Port 1 View  Result Date: 11/11/2022 CLINICAL DATA:  Chest pain. EXAM: PORTABLE CHEST 1 VIEW COMPARISON:  November 07, 2022. FINDINGS: The heart size and mediastinal contours are within normal limits. Both lungs are clear. The visualized skeletal structures are unremarkable. IMPRESSION: No  active disease. Electronically Signed   By: Lupita Raider M.D.   On: 11/11/2022 18:32   CT ABDOMEN PELVIS WO CONTRAST  Result Date: 11/10/2022 CLINICAL DATA:  Intermittent right flank pain for several months. History of kidney stones. Acute kidney failure, hypertension. EXAM: CT ABDOMEN AND PELVIS WITHOUT CONTRAST TECHNIQUE: Multidetector CT imaging of the abdomen and pelvis was performed following the standard protocol without IV contrast. RADIATION DOSE REDUCTION: This exam was performed according to the departmental dose-optimization program which includes automated exposure control, adjustment of the mA and/or kV according to patient size and/or use of iterative reconstruction technique. COMPARISON:  Abdominopelvic CT 09/16/2019. Renal ultrasound 11/08/2022. FINDINGS: Lower chest: Minimal subpleural linear atelectasis or scarring at both lung bases. The lung bases are otherwise clear. No significant pleural or pericardial effusion. Hepatobiliary: The liver appears unremarkable as imaged in the noncontrast state. No evidence of gallstones, gallbladder wall thickening or biliary dilatation. Pancreas: Unremarkable. No pancreatic ductal dilatation or surrounding inflammatory changes. Spleen: Normal in size without focal abnormality. Adrenals/Urinary Tract: Both adrenal glands appear normal. No evidence of urinary tract calculus, suspicious renal lesion or hydronephrosis. A simple appearing cyst in the interpolar region of the left kidney has mildly enlarged in the interval, measuring 5.3 cm on image 32/2. There is an additional cyst in the upper pole the left kidney. These renal lesions were seen on ultrasound and require no additional follow-up imaging. As seen on the ultrasound, there is progressive atrophy and cortical thinning of the right kidney. The bladder appears unremarkable for its degree of distention. Stomach/Bowel: No enteric contrast administered. The stomach appears unremarkable for its degree  of distension. No evidence of bowel wall thickening, distention or surrounding inflammatory change. Interval appendectomy. Vascular/Lymphatic: There are no enlarged abdominal or pelvic lymph nodes. Minimal iliac atherosclerosis. No evidence of aneurysm. Reproductive: The prostate gland and seminal vesicles appear unchanged. Other: No evidence of abdominal wall mass or hernia. No ascites or pneumoperitoneum. Subcutaneous edema within the anterior abdominal wall attributed to subcutaneous injections. Musculoskeletal: No acute or significant osseous findings. IMPRESSION: 1. No acute findings or explanation for the patient's symptoms. No evidence of urinary tract calculus or hydronephrosis. 2. Progressive atrophy and cortical thinning of the right kidney. 3. Interval appendectomy. Electronically Signed   By: Carey Bullocks M.D.   On: 11/10/2022 13:15   US Abdomen Limited RUQ (LIVER/GB)  Result Date: 11/09/2022 CLINICAL DATA:  Right upper quadrant pain and nausea for 3 days EXAM: ULTRASOUND ABDOMEN LIMITED RIGHT UPPER QUADRANT COMPARISON:  Renal ultrasound 11/08/2022.  CT scan 09/16/2019 FINDINGS: Gallbladder: No gallstones or wall thickening visualized. No sonographic Murphy sign noted by sonographer. Common bile duct: Diameter: 3 mm Liver: No focal lesion identified. Within normal limits in parenchymal echogenicity. Portal vein is patent on color Doppler imaging with normal direction of blood flow towards the liver. Other: None. IMPRESSION: No gallstones or ductal dilatation. Electronically Signed   By: Karen Kays M.D.   On: 11/09/2022 13:57  Assessment and Plan:   1. Atypical Chest Pain/Possible Pericarditis - He reports episodes of waxing and waning chest pain since this past Sunday which occurred throughout his recent admission as well. Pain is not worse with exertion, positional changes or inspiration. - Troponin values are mildly elevated, peaking at 46 and EKG does show diffuse T wave inversion  along the precordial leads. Echocardiogram is pending to assess for any structural abnormalities or pericardial effusion. - He has been started on Colchicine 0.6 mg twice daily and by review of resources, dose reduction is usually recommended if creatinine clearance is less than 30 and his is currently at 25.3. Will review with Dr. Jenene Slicker in regards to dose adjustment. If ESR and CRP are normal and echo does not show an effusion, would likely use a shorter course instead of 3 months of treatment. Cannot use NSAIDS given his CKD. Could possibly use a steroid taper if pain does not improve.   2. Syncope - He reports having an episode of near syncope or possible syncope yesterday which occurred after taking a muscle relaxer and reported having chest discomfort and palpitations at that time. Also reports nausea and vomiting. Suspect this was likely vasovagal. He is in normal sinus rhythm by examination today. Continue to follow on telemetry this admission and an echocardiogram is pending to assess for any structural abnormalities.  3. HTN - BP remains elevated, at 140/114 on most recent check but he had not yet received his morning medications. He is scheduled to receive Amlodipine 10 mg daily, Hydralazine 50 mg 3 times daily and Imdur 30 mg daily.  4. Stage 3-4 CKD - Creatinine peaked at 3.92 during his recent admission. At 3.71 today. Planning to establish with Nephrology as an outpatient.   For questions or updates, please contact [facility] Please consult [email or web address] for contact info under    Signed, Ellsworth Lennox, PA-C  11/12/2022 12:25 PM    Attending attestation   Patient is a 32 year old M known to have stage III-IV CKD, prior history of kidney stones requiring nephrostomy tubes in 2012, HTN presented to ER with worsening chest pains x 3 to 4 days prior to presentation. Chest pain has no relation with exertion, inspiration or changes in position.  No other symptoms  of DOE, orthopnea, PND or leg swelling.  Physical examination is remarkable for patient in acute respiratory distress from pain, HEENT normal, S1-S2 normal, no murmur, clear lungs, abdomen NT and ND, no edema lower extremities, AAOx3.  Labs remarkable for serum creatinine 3.71, BUN 25, troponins mildly elevated 46, 20, 23.  Echocardiogram pending.  # Uremic pericarditis -Chest pains ongoing for the last 1 year but worsening in the last 3 to 4 days. BUN 25 and serum creatinine 3.71 (BUN levels does not have to correlate for uremic pericarditis). Imaging evidence of atrophic right kidney and 5 cm cyst of the left renal cortex. ESR 25 and CRP pending. WBC within normal limits.  EKG showed T wave inversions in the anterior leads. Chest x-ray showed no vascular congestion.  Echocardiogram pending.  Allergic to aspirin, need to start steroids at the dose of 0.2 mg/Kg/day until resolution of symptoms and normalization of ESR/CRP and then tapering regimen should be considered.  # Syncope, likely vasovagal # CKD stage III-IV : Atrophic right kidney, has appointment with nephrology on 11/29/2022. # HTN, poorly controlled: Blood pressure elevated in the setting of pain.  Will reevaluate the blood pressures after pain is controlled.   Raidyn Wassink Priya  Kaisy Severino, MD [place]  [facility]  1:19 PM

## 2022-11-12 NOTE — Progress Notes (Signed)
Initial Nutrition Assessment  DOCUMENTATION CODES:      INTERVENTION:  Resume Ensure MAX- BID daily  General Healthful Nutrition edu attached to AVS  NUTRITION DIAGNOSIS:   Inadequate oral intake related to nausea, vomiting as evidenced by per patient/family report and (Meal intake records earlier this week 0-50%).   GOAL:   Pt to meet >/= 90% of their estimated nutrition needs     MONITOR:   Po intake, labs and wt trends   REASON FOR ASSESSMENT:   Malnutrition Screening Tool    ASSESSMENT: Patient is a 32 yo male with hx of chronic back pain. Tobacco smoker.   Recent hospitalization 6/23-6/27 due to acute kidney injury on CKD-3b, atrophic right kidney.   6/26 Patient still feeling nauseated per chart. His intake has ranged 0-50% of all but one meal. Patient reports vomiting yesterday but not today. Complains of abdominal pain during RD visit.   6/28 Patient readmitted due to chest pain. Chart reviewed. Anticipate d/c 1-2 days per MD. Will continue ONS as noted and add education to AVS.  Weights and medications reviewed. Currently 77 kg. A gain of 2.5 kg x 5 days.     Latest Ref Rng & Units 11/12/2022    9:19 AM 11/11/2022    5:05 PM 11/11/2022    4:15 AM  BMP  Glucose 70 - 99 mg/dL 86  161  94   BUN 6 - 20 mg/dL 25  28  27    Creatinine 0.61 - 1.24 mg/dL 0.96  0.45  4.09   Sodium 135 - 145 mmol/L 141  137  140   Potassium 3.5 - 5.1 mmol/L 3.9  3.6  4.0   Chloride 98 - 111 mmol/L 112  108  110   CO2 22 - 32 mmol/L 20  20  19    Calcium 8.9 - 10.3 mg/dL 8.8  8.6  8.5      NUTRITION - FOCUSED PHYSICAL EXAM:  Unable to complete- patient fully clothed  Diet Order:   Diet Order             Diet Heart Room service appropriate? Yes; Fluid consistency: Thin  Diet effective now                   EDUCATION NEEDS:     Skin:  Skin Assessment: Reviewed RN Assessment  Last BM:  6/27  Height:   Ht Readings from Last 1 Encounters:  11/12/22 5\' 6"  (1.676 m)     Weight:   Wt Readings from Last 1 Encounters:  11/12/22 77 kg    Ideal Body Weight:   65 kg  BMI:  Body mass index is 27.4 kg/m.  Estimated Nutritional Needs:   Kcal:   2100-2300  Protein:   90-95  Fluid:   >2 liters   Royann Shivers MS,RD,CSG,LDN Contact: AMION

## 2022-11-12 NOTE — H&P (Signed)
History and Physical    NIKOLAI PARADEE ZOX:[id number] DOB: 11-30-1990 DOA: 11/11/2022  PCP: Patient, No Pcp Per   Patient coming from: Home   Chief Complaint: Chest pain  HPI: HOBBS PAROLA is a 32 y.o. male with medical history significant for obstructive nephrolithiasis (s/p nephrostomy tubes 2012) complicated by CKD, HTN, and tobacco use disorder, who presents with chest pain.  Mr Olascoaga was admitted from 6/23-6/27 for AKI, iso likely acute viral illness, with Cr at 4.2, trended to 3.61 by discharge. Hospital stay significant for Renal U/S which revealed an atrophic right kidney. His course was complicated by hypertensive crisis and was discharged with Norvasc 10mg , Hydralazine 50mg  TID, Isosorbide 30mg .  Mr Gardocki notes that he has had a strange pain that comes and goes for the last year, moving from his chest, into his abdomen. He notes that during his admission he continued to have this pain, which was mostly concentrated to his R flank. After arriving home from the hospital yesterday, 6/27, he felt this "same pain," move into his chest, took one 750mg  Methocarbamol. Soon after, without his pain improving, he noticed his heart racing, went to the bathroom, began to feel lightheaded, and passed out (witnessed by his family). After return of consciousness he felt nauseous. He told his grandmother that he needed to go the hospital, and EMS was called.  Mr Sparhawk notes that his chest pain is worse when sitting up and leaning forward. His CP is "sometimes" worse with deep inspiration, but not every time. He endorses recent viral symptoms of URI/GI infection last Sunday, 6/23. Denies leg swelling, SOB, current nausea, diarrhea, vomiting, blurry vision.   ED Course:  Pt presented to ED with CP and a syncopal episode. He had a V/Q scan which was negative for PE. He had troponins drawn x2 until 7p on 6/27 which were 29>46. EKG significant for sinus tachycardia and some T wave abnormalities. Dr Yates Decamp with Cardiology was consulted who suggested trending troponins and ordering an echo, suspicious for pericarditis.  Review of Systems: Reviewed as noted above, otherwise negative.  Past Medical History:  Diagnosis Date   Chronic back pain    Chronic leg pain    Kidney calculi     Past Surgical History:  Procedure Laterality Date   KIDNEY STONE SURGERY       reports that he has been smoking cigarettes. He does not have any smokeless tobacco history on file. He reports current drug use. Drug: Marijuana. He reports that he does not drink alcohol.  Allergies  Allergen Reactions   Aspirin     Break out    History reviewed. No pertinent family history.  Prior to Admission medications   Medication Sig Start Date End Date Taking? Authorizing Provider  acetaminophen (TYLENOL) 325 MG tablet Take 2 tablets (650 mg total) by mouth every 6 (six) hours as needed for mild pain (or Fever >/= 101). 11/11/22   Shon Hale, MD  amLODipine (NORVASC) 10 MG tablet Take 1 tablet (10 mg total) by mouth daily. 11/12/22   Shon Hale, MD  hydrALAZINE (APRESOLINE) 50 MG tablet Take 1 tablet (50 mg total) by mouth 3 (three) times daily. 11/11/22   Shon Hale, MD  isosorbide mononitrate (IMDUR) 30 MG 24 hr tablet Take 1 tablet (30 mg total) by mouth daily. 11/12/22   Shon Hale, MD  methocarbamol (ROBAXIN) 750 MG tablet Take 1 tablet (750 mg total) by mouth 3 (three) times daily. 11/11/22   Emokpae, Courage,  MD  ondansetron (ZOFRAN) 4 MG tablet Take 1 tablet (4 mg total) by mouth every 8 (eight) hours as needed for nausea or vomiting. 11/11/22 11/11/23  Shon Hale, MD  sodium bicarbonate 650 MG tablet Take 1 tablet (650 mg total) by mouth 2 (two) times daily. 11/11/22   Shon Hale, MD    Physical Exam: Vitals:   11/12/22 0015 11/12/22 0130 11/12/22 0200 11/12/22 0530  BP: (!) 144/104 (!) 165/94 (!) 150/106 (!) 157/104  Pulse: 84 83 83 97  Resp: 15 (!) 22 (!) 22 15  Temp:  98.1 F (36.7 C)  98.1 F (36.7 C) 98.2 F (36.8 C)  TempSrc:   Oral   SpO2: 95% 94% 94% 93%    Constitutional: NAD, calm, uncomfortable Vitals:   11/12/22 0015 11/12/22 0130 11/12/22 0200 11/12/22 0530  BP: (!) 144/104 (!) 165/94 (!) 150/106 (!) 157/104  Pulse: 84 83 83 97  Resp: 15 (!) 22 (!) 22 15  Temp: 98.1 F (36.7 C)  98.1 F (36.7 C) 98.2 F (36.8 C)  TempSrc:   Oral   SpO2: 95% 94% 94% 93%   Eyes: lids and conjunctivae normal Neck: normal, supple Respiratory: clear to auscultation bilaterally. Normal respiratory effort. No accessory muscle use.  Cardiovascular: Regular rate and rhythm, no murmurs. Abdomen: tenderness to deep palpation in RLQ without rebound or guarding, no distention. Bowel sounds positive.  Musculoskeletal:  No edema. Skin: no rashes, lesions, ulcers.  Psychiatric: Flat affect  Labs on Admission: I have personally reviewed following labs and imaging studies  CBC: Recent Labs  Lab 11/07/22 2355 11/08/22 0508 11/10/22 0701 11/11/22 1705  WBC 8.3 6.5 6.1 7.7  NEUTROABS  --  5.2 4.3  --   HGB 13.9 13.0 12.7* 12.6*  HCT 42.1 39.5 39.3 38.0*  MCV 89.4 90.0 90.1 90.0  PLT 215 183 199 217   Basic Metabolic Panel: Recent Labs  Lab 11/08/22 0508 11/09/22 0416 11/10/22 0422 11/11/22 0415 11/11/22 1705  NA 137 138 138 140 137  K 4.5 4.6 4.3 4.0 3.6  CL 110 108 109 110 108  CO2 18* 18* 19* 19* 20*  GLUCOSE 88 78 84 94 160*  BUN 28* 30* 29* 27* 28*  CREATININE 3.82* 3.92* 3.78* 3.61* 3.66*  CALCIUM 8.4* 8.4* 8.3* 8.5* 8.6*  MG 1.8  --   --   --   --   PHOS  --   --  3.3 3.0  --    GFR: Estimated Creatinine Clearance: 25.7 mL/min (A) (by C-G formula based on SCr of 3.66 mg/dL (H)). Liver Function Tests: Recent Labs  Lab 11/07/22 2355 11/08/22 0508 11/10/22 0422 11/11/22 0415  AST 16 14*  --   --   ALT 17 16  --   --   ALKPHOS 119 108  --   --   BILITOT 1.0 0.9  --   --   PROT 7.3 6.7  --   --   ALBUMIN 3.9 3.6 3.2* 3.2*    Recent Labs  Lab 11/07/22 2355  LIPASE 85*   No results for input(s): "AMMONIA" in the last 168 hours. Coagulation Profile: No results for input(s): "INR", "PROTIME" in the last 168 hours. Cardiac Enzymes: No results for input(s): "CKTOTAL", "CKMB", "CKMBINDEX", "TROPONINI" in the last 168 hours. BNP (last 3 results) No results for input(s): "PROBNP" in the last 8760 hours. HbA1C: No results for input(s): "HGBA1C" in the last 72 hours. CBG: No results for input(s): "GLUCAP" in the last 168 hours.  Lipid Profile: No results for input(s): "CHOL", "HDL", "LDLCALC", "TRIG", "CHOLHDL", "LDLDIRECT" in the last 72 hours. Thyroid Function Tests: No results for input(s): "TSH", "T4TOTAL", "FREET4", "T3FREE", "THYROIDAB" in the last 72 hours. Anemia Panel: No results for input(s): "VITAMINB12", "FOLATE", "FERRITIN", "TIBC", "IRON", "RETICCTPCT" in the last 72 hours. Urine analysis:    Component Value Date/Time   COLORURINE YELLOW 11/07/2022 2258   APPEARANCEUR CLEAR 11/07/2022 2258   LABSPEC 1.011 11/07/2022 2258   PHURINE 6.0 11/07/2022 2258   GLUCOSEU NEGATIVE 11/07/2022 2258   HGBUR SMALL (A) 11/07/2022 2258   BILIRUBINUR NEGATIVE 11/07/2022 2258   KETONESUR 5 (A) 11/07/2022 2258   PROTEINUR >=300 (A) 11/07/2022 2258   UROBILINOGEN 0.2 09/16/2010 2015   NITRITE NEGATIVE 11/07/2022 2258   LEUKOCYTESUR NEGATIVE 11/07/2022 2258    Radiological Exams on Admission: NM Pulmonary Perfusion  Result Date: 11/11/2022 CLINICAL DATA:  Short of breath, chest pain EXAM: NUCLEAR MEDICINE PERFUSION LUNG SCAN TECHNIQUE: Perfusion images were obtained in multiple projections after intravenous injection of radiopharmaceutical. Ventilation scans intentionally deferred if perfusion scan and chest x-ray adequate for interpretation during COVID 19 epidemic. RADIOPHARMACEUTICALS:  4.4 mCi Tc-46m MAA IV COMPARISON:  11/11/2022 FINDINGS: Planar images of the chest are obtained in multiple projections  during the perfusion exam. There are no perfusion defects. Normal radiotracer distribution. IMPRESSION: 1. No evidence of pulmonary embolus.  No perfusion defects. Electronically Signed   By: Sharlet Salina M.D.   On: 11/11/2022 21:01   DG Chest Port 1 View  Result Date: 11/11/2022 CLINICAL DATA:  Chest pain. EXAM: PORTABLE CHEST 1 VIEW COMPARISON:  November 07, 2022. FINDINGS: The heart size and mediastinal contours are within normal limits. Both lungs are clear. The visualized skeletal structures are unremarkable. IMPRESSION: No active disease. Electronically Signed   By: Lupita Raider M.D.   On: 11/11/2022 18:32    EKG: Independently reviewed. Sinus tachycardia with T wave abnormalities.  Assessment/Plan Principal Problem:   Pleuritic chest pain Active Problems:   Nonspecific abnormal electrocardiogram (ECG) (EKG)   Acute idiopathic pericarditis   Chest pain on breathing   Chest pain c/f pericarditis PE has been ruled out with 6/27 V/Q scan. ACS has been ruled out with EKG (sinus tachy) and trending Trops 29>46>23. Mr Wigginton had a viral illness one week ago and endorses worsened CP when sitting up and leaning forward, both of which increase suspicion for a viral pericarditis (BUN low at 28, no other uremic symptoms). Given his kidney function, further pain control is limited (currently rates 6 or 7/10), but will address further. - Echo - Tylenol 650mg  q6hr prn - Consulted Cards - If pericarditis, will likely treat with Colchicine 1.2mg  BID  HTN BP is ranging 133/83-173/98, worse this morning. Amidst discharge yesterday and ED presentation last night, hasn't yet received his home meds, so will continue today. 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 His prior admission this week (6/23-6/27) for AKI with Cr up to  4.2>3.6 was evaluated by nephrology and deemed appropriate for further outpatient management, likely with urology. His renal U/S revealed an atrophic right kidney, and his new Cr baseline is certainly still settling out on the heels of this very recent discharge. Cr on this admission is 3.69 and stable. No uremic symptoms or signs of volume overload, K is wnl.  - Avoiding nephrotoxic medications -  Will continue to monitor - Outpt follow up with Nephrology  Syncopal episode c/f vasovagal Mr Miyahira syncopal episode was accompanied by a prodrome of lightheadedness and a sensation that he was about to "pass out," and succeeded by nausea. His history of discomfort and fear due to his palpitations also indicate his syncopal episode was most likely vasovagal. No evidence of concerning arrhythmia on EKG. Witnessed fall without concern for head injury.  - Will continue to monitor  DVT prophylaxis: Heparin 5k units TID Code Status: Full Family Communication: Will ask which family to update Disposition Plan: Home Consults called: Cardiology  Signed,  Marcelline Mates, MS4

## 2022-11-12 NOTE — Discharge Instructions (Signed)
General, Healthful Nutrition Therapy  This handout provides you with the information you'll need to follow a general, healthful diet, which can be tailored to your personal preferences. There are several benefits to following a general, healthful diet: It could mean less calories, less salt, less added sugars, and less saturated fat than many other diets. This outcome will depend on the foods you choose. Eating more whole grains, beans, lentils, fruits, vegetables, nuts, and seeds may improve how much fiber, vitamins, and minerals you eat.  It can lower your risk for health conditions like diabetes, heart disease, hypertension, stroke, and cancer. Your registered dietitian nutritionist (RDN) may recommend portion sizes based on your individual needs and personal and cultural preferences.  Tips Every day, eat a variety of fruits and vegetables in a variety of colors. Be sure to include lots of dark green, red, blue-purple, and orange vegetables. Choose whole grains for at least half of your grain selections. Eat more beans, peas, and lentils. Try meatless alternatives. Get protein in your diet from eggs, fish, poultry, beans, peas, lentils, and nuts/nut butters. Low-fat or fat-free dairy products are also good sources of protein. Keep your salt intake to a minimum (less than 2300 milligrams per day). Limit use of salt, soy sauce, or fish sauce when cooking. Eat freshly prepared meals at home. Processed, prepackaged, and restaurant foods contain more salt. Choose fresh fruits and vegetables for snacks. Choose products with lower sodium content when grocery shopping. Limit your daily sugar intake. Sugar may be used in sauces, marinades, dressings, and condiments - even those that do not taste sweet.  Sugar can be found in honey, syrups, jelly, fruit juice, and fruit juice concentrate. Limit sugar-sweetened beverages like sodas and fruit juice, sugary snacks, and candy. It's best to choose  products without added sugar, but if you do eat them, read labels carefully so you know how much sugar is in each portion. It is better to eat unsaturated fats than saturated fats. Use fats and oils in moderation, up to 5 servings per day. Unsaturated fat is found in fish, avocado, nuts, and oils like sunflower, canola, avocado and olive oils. Saturated fat is found in fatty meat, butter, ice cream, palm and coconut oil, cream, cheese, and lard. Many processed foods, fried foods, fast food items, convenience foods like frozen pizza and snack foods, and sweets including pies, cookies, and other pastries are high in fat. Check nutrition labels and choose these foods less often. Use vegetable oil instead of lard or butter for cooking. Boil, steam, or bake your food instead of deep frying in oil. Remove the fatty part of meats before cooking.  Foods to Choose or to Limit Choose a healthful balance of foods from each food group at your meals. Your RDN may make individualized portion size recommendations based on your needs. Food Group Foods to Choose Foods to Limit  Grains Whole wheat, barley, rye, buckwheat, corn, teff, quinoa, millet, amaranth, brown and wild rice, sorghum, and oats; focus on intact cooked whole grains Grain products, such as bread, rolls, prepared breakfast cereals, crackers, and pasta made from whole grains that are low in added sugars, saturated fat, and sodium Sweetened, low-fiber breakfast cereals Packaged (high sugar, refined ingredients) baked goods Snack crackers and chips made of refined ingredients, cheese crackers, butter crackers Breads made with refined ingredients and saturated fats, such as biscuits, frozen waffles, sweet breads, doughnuts, pastries, packaged baking mixes, pancakes, cakes, and cookies  Protein Foods Meat, including lean, trimmed cuts of   beef, pork, or lamb a few times per week or less Poultry, including skinless chicken or turkey Seafood, including  fish, shrimp, lobster, clams, and scallops at least twice per week. Focus on fatty fish, such as salmon, herring, and sardines, as a rich source of omega-3 fatty acids Eggs Nuts and seeds, such as peanuts, almonds, pistachios, and sunflower seeds (unsalted varieties) Nut and seed butters, such as peanut butter, almond butter, and sunflower seed butter (reduced-sodium varieties)  Soy foods such as tofu, tempeh, or soy nuts Plant protein-based meat alternatives, such as veggie burgers and sausages (reduced-sodium varieties) Unsalted beans, lentils, or peas at least a few times per week in place of other protein sources Marbled or fatty red meats (beef, pork, lamb), such as ribs Processed red meats, such as bacon, sausage, and ham Poultry (chicken and turkey) with skin Fried meats, poultry, or fish King mackerel, shark, and tilefish (may contain high levels of mercury) Deli meats, such as pastrami, bologna, or salami Fried eggs Salted beans, peas, lentils, nuts, seeds, or nut/seed butters Meat alternatives with high levels of sodium or saturated fat  Dairy and Dairy Alternatives Low-fat or fat-free milk, yogurt (low in added sugars), cottage cheese, and cheeses Fortified soymilk or soy yogurt Whole milk, cream, cheeses made from whole milk, sour cream Yogurt or ice cream made from whole milk or with added sugar Cream cheese made from whole milk  Vegetables A variety of vegetables, including dark-green, red, blue-purple, and orange vegetables Low-sodium vegetable juices Canned or frozen vegetables with salt, fresh vegetables prepared with salt Fried vegetables Vegetables in cream sauce or cheese sauce Tomato or pasta sauce with high levels of salt or sugar  Fruit A variety of whole fruits, canned fruit packed in water, or dried fruit 100% fruit juice (limited to  cup per day) Fruits packed in syrup or made with added sugar  Fats and Oils Unsaturated vegetable oils, including olive, peanut, and  canola oils Vegetable oil-based margarines and spreads Salad dressing and mayonnaise made from unsaturated vegetable oils Solid shortening or partially hydrogenated oils Solid margarine made with hydrogenated or partially hydrogenated oils Butter  Beverages Coffee and tea (unsweetened) Water Sweetened drinks, including sweetened coffee or tea drinks, soda, energy drinks, and sports drinks  Other Prepared foods, including soups, casseroles, salads, baked goods, and snacks made from recommended ingredients, with low levels of added saturated fat, added sugars, or salt Sugary and/or fatty desserts, candy, and other sweets; salt and seasonings that contain salt Fried foods   General, Healthful Diet Sample 1-Day Menu View Nutrient Info Breakfast 1 cup oatmeal  cup blueberries 1 ounce almonds 1 cup 1% milk or fortified soymilk 1 cup unsweetened coffee  Lunch 2 slices whole wheat bread 3 ounces turkey slices 2 lettuce leaves 2 slices tomato 1 ounce reduced-fat, reduced sodium cheese  cup carrot sticks  cup hummus 1 banana 1 cup 1% milk or fortified soymilk 1 cup unsweetened tea  Evening Meal 4 ounces salmon, baked  cup cooked brown rice 1 cup green beans, cooked 1 cup mixed greens salad 1 teaspoon olive oil mixed with vinegar of choice 1 whole wheat dinner roll 1 teaspoon margarine, soft, tub (for roll) 1 cup water  Evening Snack 1 cup low-fat yogurt  cup sliced peaches    

## 2022-11-12 NOTE — Progress Notes (Signed)
*  PRELIMINARY RESULTS* Echocardiogram 2D Echocardiogram has been performed.  Stacey Drain 11/12/2022, 3:54 PM

## 2022-11-13 DIAGNOSIS — I319 Disease of pericardium, unspecified: Secondary | ICD-10-CM | POA: Diagnosis not present

## 2022-11-13 DIAGNOSIS — Z1152 Encounter for screening for COVID-19: Secondary | ICD-10-CM | POA: Diagnosis not present

## 2022-11-13 DIAGNOSIS — J069 Acute upper respiratory infection, unspecified: Secondary | ICD-10-CM | POA: Diagnosis not present

## 2022-11-13 DIAGNOSIS — K859 Acute pancreatitis without necrosis or infection, unspecified: Secondary | ICD-10-CM | POA: Diagnosis not present

## 2022-11-13 DIAGNOSIS — G8929 Other chronic pain: Secondary | ICD-10-CM | POA: Diagnosis not present

## 2022-11-13 DIAGNOSIS — R0781 Pleurodynia: Secondary | ICD-10-CM | POA: Diagnosis not present

## 2022-11-13 DIAGNOSIS — N1832 Chronic kidney disease, stage 3b: Secondary | ICD-10-CM | POA: Diagnosis not present

## 2022-11-13 DIAGNOSIS — I300 Acute nonspecific idiopathic pericarditis: Secondary | ICD-10-CM | POA: Diagnosis not present

## 2022-11-13 DIAGNOSIS — B9789 Other viral agents as the cause of diseases classified elsewhere: Secondary | ICD-10-CM | POA: Diagnosis not present

## 2022-11-13 DIAGNOSIS — I129 Hypertensive chronic kidney disease with stage 1 through stage 4 chronic kidney disease, or unspecified chronic kidney disease: Secondary | ICD-10-CM | POA: Diagnosis not present

## 2022-11-13 DIAGNOSIS — R9431 Abnormal electrocardiogram [ECG] [EKG]: Secondary | ICD-10-CM | POA: Diagnosis not present

## 2022-11-13 DIAGNOSIS — Z8249 Family history of ischemic heart disease and other diseases of the circulatory system: Secondary | ICD-10-CM | POA: Diagnosis not present

## 2022-11-13 DIAGNOSIS — D649 Anemia, unspecified: Secondary | ICD-10-CM | POA: Diagnosis not present

## 2022-11-13 DIAGNOSIS — R0789 Other chest pain: Secondary | ICD-10-CM | POA: Diagnosis not present

## 2022-11-13 DIAGNOSIS — N179 Acute kidney failure, unspecified: Secondary | ICD-10-CM | POA: Diagnosis not present

## 2022-11-13 DIAGNOSIS — E872 Acidosis, unspecified: Secondary | ICD-10-CM | POA: Diagnosis not present

## 2022-11-13 DIAGNOSIS — R079 Chest pain, unspecified: Secondary | ICD-10-CM | POA: Diagnosis not present

## 2022-11-13 DIAGNOSIS — I2510 Atherosclerotic heart disease of native coronary artery without angina pectoris: Secondary | ICD-10-CM | POA: Diagnosis not present

## 2022-11-13 DIAGNOSIS — F1210 Cannabis abuse, uncomplicated: Secondary | ICD-10-CM | POA: Diagnosis not present

## 2022-11-13 DIAGNOSIS — M549 Dorsalgia, unspecified: Secondary | ICD-10-CM | POA: Diagnosis not present

## 2022-11-13 DIAGNOSIS — Z79899 Other long term (current) drug therapy: Secondary | ICD-10-CM | POA: Diagnosis not present

## 2022-11-13 DIAGNOSIS — N184 Chronic kidney disease, stage 4 (severe): Secondary | ICD-10-CM | POA: Diagnosis not present

## 2022-11-13 DIAGNOSIS — F17210 Nicotine dependence, cigarettes, uncomplicated: Secondary | ICD-10-CM | POA: Diagnosis not present

## 2022-11-13 LAB — BASIC METABOLIC PANEL
Anion gap: 10 (ref 5–15)
BUN: 28 mg/dL — ABNORMAL HIGH (ref 6–20)
CO2: 21 mmol/L — ABNORMAL LOW (ref 22–32)
Calcium: 8.7 mg/dL — ABNORMAL LOW (ref 8.9–10.3)
Chloride: 107 mmol/L (ref 98–111)
Creatinine, Ser: 3.98 mg/dL — ABNORMAL HIGH (ref 0.61–1.24)
GFR, Estimated: 19 mL/min — ABNORMAL LOW (ref >60)
Glucose, Bld: 92 mg/dL (ref 70–99)
Potassium: 3.9 mmol/L (ref 3.5–5.1)
Sodium: 138 mmol/L (ref 135–145)

## 2022-11-13 LAB — CBC
HCT: 39.5 % (ref 39.0–52.0)
Hemoglobin: 13 g/dL (ref 13.0–17.0)
MCH: 29.3 pg (ref 26.0–34.0)
MCHC: 32.9 g/dL (ref 30.0–36.0)
MCV: 89.2 fL (ref 80.0–100.0)
Platelets: 243 10*3/uL (ref 150–400)
RBC: 4.43 MIL/uL (ref 4.22–5.81)
RDW: 13.9 % (ref 11.5–15.5)
WBC: 6.7 10*3/uL (ref 4.0–10.5)
nRBC: 0 % (ref 0.0–0.2)

## 2022-11-13 LAB — SEDIMENTATION RATE: Sed Rate: 27 mm/hr — ABNORMAL HIGH (ref 0–16)

## 2022-11-13 LAB — MAGNESIUM: Magnesium: 2.1 mg/dL (ref 1.7–2.4)

## 2022-11-13 MED ORDER — GUAIFENESIN 100 MG/5ML PO LIQD
5.0000 mL | ORAL | Status: DC | PRN
  Administered 2022-11-13 – 2022-11-16 (×3): 5 mL via ORAL
  Filled 2022-11-13 (×3): qty 5

## 2022-11-13 MED ORDER — PANTOPRAZOLE SODIUM 40 MG PO TBEC
40.0000 mg | DELAYED_RELEASE_TABLET | Freq: Every day | ORAL | Status: DC
  Administered 2022-11-13 – 2022-11-16 (×4): 40 mg via ORAL
  Filled 2022-11-13 (×4): qty 1

## 2022-11-13 MED ORDER — ALPRAZOLAM 0.25 MG PO TABS
0.2500 mg | ORAL_TABLET | Freq: Three times a day (TID) | ORAL | Status: DC | PRN
  Administered 2022-11-13 – 2022-11-15 (×2): 0.25 mg via ORAL
  Filled 2022-11-13 (×2): qty 1

## 2022-11-13 MED ORDER — PREDNISONE 20 MG PO TABS
20.0000 mg | ORAL_TABLET | Freq: Every day | ORAL | Status: DC
  Administered 2022-11-13 – 2022-11-16 (×4): 20 mg via ORAL
  Filled 2022-11-13 (×4): qty 1

## 2022-11-13 NOTE — TOC Initial Note (Signed)
Transition of Care [facility]) - Initial/Assessment Note    Patient Details  Name: Manuel Riggs MRN: [id number] Date of Birth: 10-20-1990  [facility] CM/SW Contact:    Larrie Kass, LCSW Phone Number: 11/13/2022, 2:02 PM  Clinical Narrative:                 CSW spoke with pt regarding consult for PCP. CSW spoke with pt regarding PCP, and informed her he will need to call the number on back of insurance card. Pt reported not having a insurance, and not knowing about his Database administrator. Pt provided his Email address. CSW sent pt's insurance information to him via email.No further TOC needs , TOC sign off.  Expected Discharge Plan: Home/Self Care Barriers to Discharge: Continued Medical Work up   Patient Goals and CMS Choice Patient states their goals for this hospitalization and ongoing recovery are:: return home          Expected Discharge Plan and Services In-house Referral: Clinical Social Work     Living arrangements for the past 2 months: Hotel/Motel                                      Prior Living Arrangements/Services Living arrangements for the past 2 months: Hotel/Motel Lives with:: Self   Do you feel safe going back to the place where you live?: Yes      Need for Family Participation in Patient Care: No (Comment) Care giver support system in place?: No (comment)   Criminal Activity/Legal Involvement Pertinent to Current Situation/Hospitalization: No - Comment as needed  Activities of Daily Living Home Assistive Devices/Equipment: None ADL Screening (condition at time of admission) Patient's cognitive ability adequate to safely complete daily activities?: Yes Is the patient deaf or have difficulty hearing?: No Does the patient have difficulty seeing, even when wearing glasses/contacts?: No Does the patient have difficulty concentrating, remembering, or making decisions?: No Patient able to express need for assistance with ADLs?:  Yes Does the patient have difficulty dressing or bathing?: No Independently performs ADLs?: Yes (appropriate for developmental age) Does the patient have difficulty walking or climbing stairs?: No Weakness of Legs: None Weakness of Arms/Hands: None  Permission Sought/Granted                  Emotional Assessment Appearance:: Appears stated age Attitude/Demeanor/Rapport: Gracious, Engaged Affect (typically observed): Accepting Orientation: : Oriented to Self, Oriented to Place, Oriented to  Time, Oriented to Situation   Psych Involvement: No (comment)  Admission diagnosis:  Pleuritic chest pain [R07.81] Pericarditis [I31.9] Patient Active Problem List   Diagnosis Date Noted   Pericarditis 11/13/2022   Pleuritic chest pain 11/12/2022   Nonspecific abnormal electrocardiogram (ECG) (EKG) 11/11/2022   Acute idiopathic pericarditis 11/11/2022   Chest pain on breathing 11/11/2022   Metabolic acidosis 11/09/2022   AKI (acute kidney injury) (HCC) 11/08/2022   Tobacco use disorder 11/08/2022   Hypertensive crisis 11/08/2022   PCP:  Patient, No Pcp Per Pharmacy:   [facility] [address] [place] - [address] [place] [address] Phone: [phone] Fax: [phone]     Social Determinants of Health (SDOH) Social History: SDOH Screenings   Food Insecurity: No Food Insecurity (11/12/2022)  Housing: Low Risk  (11/12/2022)  Transportation Needs: No Transportation Needs (11/12/2022)  Utilities: Not At Risk (11/12/2022)  Tobacco Use: High Risk (11/12/2022)  SDOH Interventions:     Readmission Risk Interventions     No data to display

## 2022-11-13 NOTE — Progress Notes (Signed)
PROGRESS NOTE    Manuel Riggs  KGM:[id number] DOB: [birth year] DOA: 11/11/2022 PCP: Patient, No Pcp Per   Brief Narrative:    Manuel Riggs is a 32 y.o. male with medical history significant for obstructive nephrolithiasis (s/p nephrostomy tubes 2012) complicated by CKD, HTN, and tobacco use disorder, who presents with chest pain.   [name] was admitted from 6/23-6/27 for AKI, iso likely acute viral illness, with Cr at 4.2, trended to 3.61 by discharge. Hospital stay significant for Renal U/S which revealed an atrophic right kidney. His course was complicated by hypertensive crisis and was discharged with Norvasc 10mg , Hydralazine 50mg  TID, Isosorbide 30mg .  He has now been readmitted for suspicion of uremic pericarditis.  Assessment & Plan:   Principal Problem:   Pleuritic chest pain Active Problems:   Nonspecific abnormal electrocardiogram (ECG) (EKG)   Acute idiopathic pericarditis   Chest pain on breathing  Assessment and Plan:   Chest pain c/f pericarditis PE has been ruled out with 6/27 V/Q scan. ACS has been ruled out with EKG (sinus tachy) and trending Trops 29>46>23. [name] had a viral illness one week ago and endorses worsened CP when sitting up and leaning forward, both of which increase suspicion for a viral pericarditis (BUN low at 28, no other uremic symptoms). Given his kidney function, further pain control is limited (currently rates 6 or 7/10), but will address further. -Likely secondary to rhinovirus infection - Echo with preserved LVEF and no other acute findings -Appreciate cardiology recommendations and will start prednisone 20 mg daily and follow ESR and CRP -Hold further use of colchicine   HTN BP is ranging 133/83-173/98, worse this morning. Amidst discharge yesterday and ED presentation last night, hasn't yet received his home meds, so will continue today. 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 His prior admission this week (6/23-6/27) for AKI with Cr up to 4.2>3.6 was evaluated by nephrology and deemed appropriate for further outpatient management, likely with urology. His renal U/S revealed an atrophic right kidney, and his new Cr baseline is certainly still settling out on the heels of this very recent discharge. Cr on this admission is 3.69 and stable. No uremic symptoms or signs of volume overload, K is wnl.  -Fluctuating creatinine levels noted, continue to monitor - Avoiding nephrotoxic medications - Outpt follow up with Nephrology   Syncopal episode c/f vasovagal [name] syncopal episode was accompanied by a prodrome of lightheadedness and a sensation that he was about to "pass out," and succeeded by nausea. His history of discomfort and fear due to his palpitations also indicate his syncopal episode was most likely vasovagal. No evidence of concerning arrhythmia on EKG. Witnessed fall without concern for head injury.  - Will continue to monitor   DVT prophylaxis:Heparin Code Status: Full Family Communication: None at bedside Disposition Plan:  Status is: Observation The patient remains OBS appropriate and will d/c before 2 midnights.   Consultants:  Cardiology  Procedures:  None  Antimicrobials:  None   Subjective: Patient seen and evaluated today with ongoing chest pain noted with minimal improvement.  He is noted to have some sinus congestion.  Objective: Vitals:   11/12/22 1200 11/12/22 2019 11/13/22 0500 11/13/22 0803  BP:  121/74 132/80 (!) 150/86  Pulse:  (!) 110 81 97  Resp:  18 19 18   Temp:  98.8 F (37.1 C) 98.7 F (37.1 C)   TempSrc:  Oral Oral   SpO2:  96% 98% 98%  Weight: 77 kg     Height: 5\' 6"  (1.676 m)       Intake/Output Summary (Last 24 hours) at 11/13/2022 0935 Last data filed at 11/12/2022 1700 Gross per 24 hour  Intake  480 ml  Output --  Net 480 ml   Filed Weights   11/12/22 1200  Weight: 77 kg    Examination:  General exam: Appears calm and comfortable  Respiratory system: Clear to auscultation. Respiratory effort normal. Cardiovascular system: S1 & S2 heard, RRR.  Gastrointestinal system: Abdomen is soft Central nervous system: Alert and awake Extremities: No edema Skin: No significant lesions noted Psychiatry: Flat affect.    Data Reviewed: I have personally reviewed following labs and imaging studies  CBC: Recent Labs  Lab 11/08/22 0508 11/10/22 0701 11/11/22 1705 11/12/22 0919 11/13/22 0537  WBC 6.5 6.1 7.7 5.5 6.7  NEUTROABS 5.2 4.3  --   --   --   HGB 13.0 12.7* 12.6* 13.8 13.0  HCT 39.5 39.3 38.0* 42.8 39.5  MCV 90.0 90.1 90.0 90.1 89.2  PLT 183 199 217 211 243   Basic Metabolic Panel: Recent Labs  Lab 11/08/22 0508 11/09/22 0416 11/10/22 0422 11/11/22 0415 11/11/22 1705 11/12/22 0919 11/13/22 0537  NA 137   < > 138 140 137 141 138  K 4.5   < > 4.3 4.0 3.6 3.9 3.9  CL 110   < > 109 110 108 112* 107  CO2 18*   < > 19* 19* 20* 20* 21*  GLUCOSE 88   < > 84 94 160* 86 92  BUN 28*   < > 29* 27* 28* 25* 28*  CREATININE 3.82*   < > 3.78* 3.61* 3.66* 3.71* 3.98*  CALCIUM 8.4*   < > 8.3* 8.5* 8.6* 8.8* 8.7*  MG 1.8  --   --   --   --   --  2.1  PHOS  --   --  3.3 3.0  --   --   --    < > = values in this interval not displayed.   GFR: Estimated Creatinine Clearance: 25.6 mL/min (A) (by C-G formula based on SCr of 3.98 mg/dL (H)). Liver Function Tests: Recent Labs  Lab 11/07/22 2355 11/08/22 0508 11/10/22 0422 11/11/22 0415 11/12/22 0919  AST 16 14*  --   --  32  ALT 17 16  --   --  34  ALKPHOS 119 108  --   --  87  BILITOT 1.0 0.9  --   --  0.6  PROT 7.3 6.7  --   --  7.2  ALBUMIN 3.9 3.6 3.2* 3.2* 3.5   Recent Labs  Lab 11/07/22 2355  LIPASE 85*   No results for input(s): "AMMONIA" in the last 168 hours. Coagulation Profile: No results for  input(s): "INR", "PROTIME" in the last 168 hours. Cardiac Enzymes: No results for input(s): "CKTOTAL", "CKMB", "CKMBINDEX", "TROPONINI" in the last 168 hours. BNP (last 3 results) No results for input(s): "PROBNP" in the last 8760 hours. HbA1C: No results for input(s): "HGBA1C" in the last 72 hours. CBG: No results for input(s): "GLUCAP" in the last 168 hours. Lipid Profile: No results for input(s): "CHOL", "HDL", "LDLCALC", "TRIG", "CHOLHDL", "LDLDIRECT" in the last 72 hours. Thyroid Function Tests: No results for input(s): "TSH", "T4TOTAL", "FREET4", "T3FREE", "THYROIDAB" in the last 72 hours. Anemia Panel:  No results for input(s): "VITAMINB12", "FOLATE", "FERRITIN", "TIBC", "IRON", "RETICCTPCT" in the last 72 hours. Sepsis Labs: Recent Labs  Lab 11/07/22 2355  LATICACIDVEN 0.9    Recent Results (from the past 240 hour(s))  SARS Coronavirus 2 by RT PCR (hospital order, performed in [facility] hospital lab) *cepheid single result test* Anterior Nasal Swab     Status: None   Collection Time: 11/12/22 11:00 AM   Specimen: Anterior Nasal Swab  Result Value Ref Range Status   SARS Coronavirus 2 by RT PCR NEGATIVE NEGATIVE Final    Comment: (NOTE) SARS-CoV-2 target nucleic acids are NOT DETECTED.  The SARS-CoV-2 RNA is generally detectable in upper and lower respiratory specimens during the acute phase of infection. The lowest concentration of SARS-CoV-2 viral copies this assay can detect is 250 copies / mL. A negative result does not preclude SARS-CoV-2 infection and should not be used as the sole basis for treatment or other patient management decisions.  A negative result may occur with improper specimen collection / handling, submission of specimen other than nasopharyngeal swab, presence of viral mutation(s) within the areas targeted by this assay, and inadequate number of viral copies (<250 copies / mL). A negative result must be combined with clinical observations,  patient history, and epidemiological information.  Fact Sheet for Patients:   RoadLapTop.co.za  Fact Sheet for Healthcare Providers: http://kim-miller.com/  This test is not yet approved or  cleared by the [place] FDA and has been authorized for detection and/or diagnosis of SARS-CoV-2 by FDA under an Emergency Use Authorization (EUA).  This EUA will remain in effect (meaning this test can be used) for the duration of the COVID-19 declaration under Section 564(b)(1) of the Act, 21 U.S.C. section 360bbb-3(b)(1), unless the authorization is terminated or revoked sooner.  Performed at [facility], [address], [place], [place] [address]   Respiratory (~20 pathogens) panel by PCR     Status: Abnormal   Collection Time: 11/12/22 11:33 AM   Specimen: Nasopharyngeal Swab; Respiratory  Result Value Ref Range Status   Adenovirus NOT DETECTED NOT DETECTED Final   Coronavirus 229E NOT DETECTED NOT DETECTED Final    Comment: (NOTE) The Coronavirus on the Respiratory Panel, DOES NOT test for the novel  Coronavirus (2019 nCoV)    Coronavirus HKU1 NOT DETECTED NOT DETECTED Final   Coronavirus NL63 NOT DETECTED NOT DETECTED Final   Coronavirus OC43 NOT DETECTED NOT DETECTED Final   Metapneumovirus NOT DETECTED NOT DETECTED Final   Rhinovirus / Enterovirus DETECTED (A) NOT DETECTED Final   Influenza A NOT DETECTED NOT DETECTED Final   Influenza B NOT DETECTED NOT DETECTED Final   Parainfluenza Virus 1 NOT DETECTED NOT DETECTED Final   Parainfluenza Virus 2 NOT DETECTED NOT DETECTED Final   Parainfluenza Virus 3 NOT DETECTED NOT DETECTED Final   Parainfluenza Virus 4 NOT DETECTED NOT DETECTED Final   Respiratory Syncytial Virus NOT DETECTED NOT DETECTED Final   Bordetella pertussis NOT DETECTED NOT DETECTED Final   Bordetella Parapertussis NOT DETECTED NOT DETECTED Final   Chlamydophila pneumoniae NOT DETECTED NOT DETECTED Final    Mycoplasma pneumoniae NOT DETECTED NOT DETECTED Final    Comment: Performed at [facility], [address], [place], [place] [address]         Radiology Studies: ECHOCARDIOGRAM COMPLETE  Result Date: 11/12/2022    ECHOCARDIOGRAM REPORT   Patient Name:   JEFFRIE DICOLA Date of Exam: 11/12/2022 Medical Rec #:  [id number]  Height:       66.0 in Accession #:    [id number]    Weight:       169.8 lb Date of Birth:  04-Jan-1991     BSA:          1.865 m Patient Age:    32 years      BP:           168/113 mmHg Patient Gender: M             HR:           91 bpm. [facility] Procedure: 2D Echo, Cardiac Doppler and Color Doppler Indications:    Chest Pain R07.9  History:        Patient has no prior history of Echocardiogram examinations.                 Risk Factors:Tobacco use disorder. Pleuritic chest pain.  Sonographer:    Celesta Gentile RCS Referring Phys: [id number] Mirranda Monrroy D [facility] IMPRESSIONS  1. Left ventricular ejection fraction, by estimation, is 60 to 65%. The left ventricle has normal function. The left ventricle has no regional wall motion abnormalities. There is mild left ventricular hypertrophy. Left ventricular diastolic parameters were normal.  2. Right ventricular systolic function is normal. The right ventricular size is normal.  3. The mitral valve is normal in structure. No evidence of mitral valve regurgitation. No evidence of mitral stenosis.  4. The aortic valve is tricuspid. Aortic valve regurgitation is not visualized. No aortic stenosis is present.  5. The inferior vena cava is normal in size with greater than 50% respiratory variability, suggesting right atrial pressure of 3 mmHg. FINDINGS  Left Ventricle: Left ventricular ejection fraction, by estimation, is 60 to 65%. The left ventricle has normal function. The left ventricle has no regional wall motion abnormalities. The left ventricular internal cavity size was normal in size. There is  mild left ventricular hypertrophy.  Left ventricular diastolic parameters were normal. Right Ventricle: The right ventricular size is normal. No increase in right ventricular wall thickness. Right ventricular systolic function is normal. Left Atrium: Left atrial size was normal in size. Right Atrium: Right atrial size was normal in size. Pericardium: There is no evidence of pericardial effusion. Mitral Valve: The mitral valve is normal in structure. No evidence of mitral valve regurgitation. No evidence of mitral valve stenosis. Tricuspid Valve: The tricuspid valve is normal in structure. Tricuspid valve regurgitation is not demonstrated. No evidence of tricuspid stenosis. Aortic Valve: The aortic valve is tricuspid. Aortic valve regurgitation is not visualized. No aortic stenosis is present. Pulmonic Valve: The pulmonic valve was normal in structure. Pulmonic valve regurgitation is not visualized. No evidence of pulmonic stenosis. Aorta: The aortic root is normal in size and structure. Venous: The inferior vena cava is normal in size with greater than 50% respiratory variability, suggesting right atrial pressure of 3 mmHg. IAS/Shunts: No atrial level shunt detected by color flow Doppler.  LEFT VENTRICLE PLAX 2D LVIDd:         4.00 cm   Diastology LVIDs:         2.20 cm   LV e' medial:    6.20 cm/s LV PW:         1.20 cm   LV E/e' medial:  16.6 LV IVS:        1.30 cm   LV e' lateral:   10.80 cm/s LVOT diam:     2.00 cm  LV E/e' lateral: 9.5 LV SV:         73 LV SV Index:   39 LVOT Area:     3.14 cm  RIGHT VENTRICLE RV S prime:     16.40 cm/s TAPSE (M-mode): 2.2 cm LEFT ATRIUM           Index        RIGHT ATRIUM           Index LA diam:      3.20 cm 1.72 cm/m   RA Area:     13.20 cm LA Vol (A2C): 22.4 ml 12.01 ml/m  RA Volume:   27.50 ml  14.74 ml/m LA Vol (A4C): 22.5 ml 12.06 ml/m  AORTIC VALVE LVOT Vmax:   140.00 cm/s LVOT Vmean:  90.500 cm/s LVOT VTI:    0.231 m  AORTA Ao Root diam: 3.20 cm MITRAL VALVE MV Area (PHT): 3.12 cm     SHUNTS MV  Decel Time: 243 msec     Systemic VTI:  0.23 m MV E velocity: 103.00 cm/s  Systemic Diam: 2.00 cm MV A velocity: 74.80 cm/s MV E/A ratio:  1.38 Charlton Haws MD Electronically signed by Charlton Haws MD Signature Date/Time: 11/12/2022/4:10:32 PM    Final    NM Pulmonary Perfusion  Result Date: 11/11/2022 CLINICAL DATA:  Short of breath, chest pain EXAM: NUCLEAR MEDICINE PERFUSION LUNG SCAN TECHNIQUE: Perfusion images were obtained in multiple projections after intravenous injection of radiopharmaceutical. Ventilation scans intentionally deferred if perfusion scan and chest x-ray adequate for interpretation during COVID 19 epidemic. RADIOPHARMACEUTICALS:  4.4 mCi Tc-31m MAA IV COMPARISON:  11/11/2022 FINDINGS: Planar images of the chest are obtained in multiple projections during the perfusion exam. There are no perfusion defects. Normal radiotracer distribution. IMPRESSION: 1. No evidence of pulmonary embolus.  No perfusion defects. Electronically Signed   By: Sharlet Salina M.D.   On: 11/11/2022 21:01   DG Chest Port 1 View  Result Date: 11/11/2022 CLINICAL DATA:  Chest pain. EXAM: PORTABLE CHEST 1 VIEW COMPARISON:  November 07, 2022. FINDINGS: The heart size and mediastinal contours are within normal limits. Both lungs are clear. The visualized skeletal structures are unremarkable. IMPRESSION: No active disease. Electronically Signed   By: Lupita Raider M.D.   On: 11/11/2022 18:32        Scheduled Meds:  amLODipine  10 mg Oral Daily   heparin  5,000 Units Subcutaneous Q8H   hydrALAZINE  50 mg Oral TID   isosorbide mononitrate  30 mg Oral Daily   predniSONE  20 mg Oral Q breakfast   Ensure Max Protein  11 oz Oral Daily   sodium bicarbonate  650 mg Oral BID   sodium chloride flush  3 mL Intravenous Q12H   Continuous Infusions:  sodium chloride       LOS: 0 days    Time spent: 35 minutes    Marien Manship Hoover Brunette, DO [facility]  If 7PM-7AM, please contact  night-coverage [email or web address] 11/13/2022, 9:35 AM

## 2022-11-13 NOTE — Progress Notes (Signed)
Telemetry called stating patient's hr was in the 140-150s. Patient was up standing at the window when this nurse went to assess patient. Patient was assisted back to the bed. Vitals are 151/89 bp, hr 141, 98 o2, 17 rr. MD Sherryll Burger notified.

## 2022-11-13 NOTE — Progress Notes (Signed)
Patient states that his abdomen is hurting and is getting nauseated with eating. MD Sherryll Burger notified.

## 2022-11-14 ENCOUNTER — Inpatient Hospital Stay (HOSPITAL_COMMUNITY): Payer: 59

## 2022-11-14 DIAGNOSIS — R0781 Pleurodynia: Secondary | ICD-10-CM | POA: Diagnosis not present

## 2022-11-14 LAB — HEPATIC FUNCTION PANEL
ALT: 42 U/L (ref 0–44)
AST: 25 U/L (ref 15–41)
Albumin: 3.3 g/dL — ABNORMAL LOW (ref 3.5–5.0)
Alkaline Phosphatase: 81 U/L (ref 38–126)
Bilirubin, Direct: <0.1 mg/dL (ref 0.0–0.2)
Total Bilirubin: 0.3 mg/dL (ref 0.3–1.2)
Total Protein: 6.6 g/dL (ref 6.5–8.1)

## 2022-11-14 LAB — BASIC METABOLIC PANEL
Anion gap: 8 (ref 5–15)
BUN: 45 mg/dL — ABNORMAL HIGH (ref 6–20)
CO2: 21 mmol/L — ABNORMAL LOW (ref 22–32)
Calcium: 8.6 mg/dL — ABNORMAL LOW (ref 8.9–10.3)
Chloride: 107 mmol/L (ref 98–111)
Creatinine, Ser: 4.6 mg/dL — ABNORMAL HIGH (ref 0.61–1.24)
GFR, Estimated: 16 mL/min — ABNORMAL LOW (ref >60)
Glucose, Bld: 87 mg/dL (ref 70–99)
Potassium: 4.3 mmol/L (ref 3.5–5.1)
Sodium: 136 mmol/L (ref 135–145)

## 2022-11-14 LAB — CBC
HCT: 39.5 % (ref 39.0–52.0)
Hemoglobin: 12.8 g/dL — ABNORMAL LOW (ref 13.0–17.0)
MCH: 29.2 pg (ref 26.0–34.0)
MCHC: 32.4 g/dL (ref 30.0–36.0)
MCV: 90 fL (ref 80.0–100.0)
Platelets: 266 10*3/uL (ref 150–400)
RBC: 4.39 MIL/uL (ref 4.22–5.81)
RDW: 13.5 % (ref 11.5–15.5)
WBC: 9.1 10*3/uL (ref 4.0–10.5)
nRBC: 0 % (ref 0.0–0.2)

## 2022-11-14 LAB — URINALYSIS, ROUTINE W REFLEX MICROSCOPIC
Bacteria, UA: NONE SEEN
Bilirubin Urine: NEGATIVE
Glucose, UA: NEGATIVE mg/dL
Hgb urine dipstick: NEGATIVE
Ketones, ur: NEGATIVE mg/dL
Leukocytes,Ua: NEGATIVE
Nitrite: NEGATIVE
Protein, ur: 100 mg/dL — AB
Specific Gravity, Urine: 1.009 (ref 1.005–1.030)
pH: 6 (ref 5.0–8.0)

## 2022-11-14 LAB — SODIUM, URINE, RANDOM: Sodium, Ur: 54 mmol/L

## 2022-11-14 LAB — C-REACTIVE PROTEIN: CRP: 0.5 mg/dL (ref <1.0)

## 2022-11-14 LAB — MAGNESIUM: Magnesium: 2.4 mg/dL (ref 1.7–2.4)

## 2022-11-14 LAB — PROTEIN / CREATININE RATIO, URINE
Creatinine, Urine: 70 mg/dL
Protein Creatinine Ratio: 1.84 mg/mg{Cre} — ABNORMAL HIGH (ref 0.00–0.15)
Total Protein, Urine: 129 mg/dL

## 2022-11-14 LAB — SEDIMENTATION RATE: Sed Rate: 16 mm/hr (ref 0–16)

## 2022-11-14 LAB — CREATININE, URINE, RANDOM: Creatinine, Urine: 70 mg/dL

## 2022-11-14 LAB — LIPASE, BLOOD: Lipase: 75 U/L — ABNORMAL HIGH (ref 11–51)

## 2022-11-14 MED ORDER — LACTATED RINGERS IV SOLN: INTRAVENOUS | Status: DC

## 2022-11-14 NOTE — Progress Notes (Signed)
Patient states when he starts coughing he gets lightheaded and feels like he is going to pass out. MD Sherryll Burger notified.

## 2022-11-14 NOTE — Progress Notes (Signed)
Patient aware that we need urine specimen. Specimen cup provided to patient.

## 2022-11-14 NOTE — Progress Notes (Signed)
PROGRESS NOTE    Manuel Riggs  ZOX:[id number] DOB: [birth year] DOA: 11/11/2022 PCP: Patient, No Pcp Per   Brief Narrative:    Manuel Riggs is a 32 y.o. male with medical history significant for obstructive nephrolithiasis (s/p nephrostomy tubes 2012) complicated by CKD, HTN, and tobacco use disorder, who presents with chest pain.   Manuel Riggs was admitted from 6/23-6/27 for AKI, iso likely acute viral illness, with Cr at 4.2, trended to 3.61 by discharge. Hospital stay significant for Renal U/S which revealed an atrophic right kidney. His course was complicated by hypertensive crisis and was discharged with Norvasc 10mg , Hydralazine 50mg  TID, Isosorbide 30mg .  He has now been readmitted for suspicion acute pericarditis.  Assessment & Plan:   Principal Problem:   Pleuritic chest pain Active Problems:   Nonspecific abnormal electrocardiogram (ECG) (EKG)   Acute idiopathic pericarditis   Chest pain on breathing   Pericarditis  Assessment and Plan:   Chest pain c/f pericarditis PE has been ruled out with 6/27 V/Q scan. ACS has been ruled out with EKG (sinus tachy) and trending Trops 29>46>23. Manuel Riggs had a viral illness one week ago and endorses worsened CP when sitting up and leaning forward, both of which increase suspicion for a viral pericarditis (BUN low at 28, no other uremic symptoms). Given his kidney function, further pain control is limited (currently rates 6 or 7/10), but will address further. -Likely secondary to rhinovirus infection - Echo with preserved LVEF and no other acute findings -Appreciate cardiology recommendations and will start prednisone 20 mg daily and follow ESR and CRP -Hold further use of colchicine   HTN BP is ranging 133/83-173/98, worse this morning. Amidst discharge yesterday and ED presentation last night, hasn't yet received his home meds, so will continue today. - Amlodipine 10mg  - Hydralazine 50mg  TID - Imdur 30mg    AKI on CKD III-IV Pt with  history of obstructive nephrolithiasis with staghorn calculus in 2012 which was treated with temporary nephrostomy tubes. His prior admission this week (6/23-6/27) for AKI with Cr up to 4.2>3.6 was evaluated by nephrology and deemed appropriate for further outpatient management, likely with urology. His renal U/S revealed an atrophic right kidney, and his new Cr baseline is certainly still settling out on the heels of this very recent discharge. Cr on this admission is 3.69 and stable. No uremic symptoms or signs of volume overload, K is wnl.  -Creatinine levels now elevating and renal ultrasound with no significant findings -Start IV fluid -Appreciate nephrology evaluation 6/30   Syncopal episode c/f vasovagal Manuel Riggs syncopal episode was accompanied by a prodrome of lightheadedness and a sensation that he was about to "pass out," and succeeded by nausea. His history of discomfort and fear due to his palpitations also indicate his syncopal episode was most likely vasovagal. No evidence of concerning arrhythmia on EKG. Witnessed fall without concern for head injury.  - Will continue to monitor  Abdominal pain with nausea and lipase elevation suspect mild pancreatitis -Continue to follow a.m. labs -Had recent CT scan as well as ultrasound with no gallstones noted -Keep n.p.o. except sips and medications and start IV fluid   DVT prophylaxis:Heparin Code Status: Full Family Communication: None at bedside Disposition Plan:  Status is: Inpatient Remains inpatient appropriate because: Need for IV medications   Consultants:  Cardiology Nephrology  Procedures:  None  Antimicrobials:  None   Subjective: Patient seen and evaluated today with worsening abdominal pain as well as some nausea this morning.  He is reluctant to eat.  Creatinine levels upward trending.  Objective: Vitals:   11/13/22 2142 11/13/22 2352 11/14/22 0502 11/14/22 0816  BP: (!) 132/95 137/89 132/86 (!) 168/101   Pulse: (!) 106 (!) 103 90   Resp:  17 18   Temp:  98.2 F (36.8 C) 98 F (36.7 C)   TempSrc:  Oral Oral   SpO2: 97% 96% 98%   Weight:      Height:        Intake/Output Summary (Last 24 hours) at 11/14/2022 1220 Last data filed at 11/14/2022 0900 Gross per 24 hour  Intake 720 ml  Output --  Net 720 ml   Filed Weights   11/12/22 1200  Weight: 77 kg    Examination:  General exam: Appears calm and comfortable  Respiratory system: Clear to auscultation. Respiratory effort normal. Cardiovascular system: S1 & S2 heard, RRR.  Gastrointestinal system: Abdomen is soft Central nervous system: Alert and awake Extremities: No edema Skin: No significant lesions noted Psychiatry: Flat affect.    Data Reviewed: I have personally reviewed following labs and imaging studies  CBC: Recent Labs  Lab 11/08/22 0508 11/10/22 0701 11/11/22 1705 11/12/22 0919 11/13/22 0537 11/14/22 0442  WBC 6.5 6.1 7.7 5.5 6.7 9.1  NEUTROABS 5.2 4.3  --   --   --   --   HGB 13.0 12.7* 12.6* 13.8 13.0 12.8*  HCT 39.5 39.3 38.0* 42.8 39.5 39.5  MCV 90.0 90.1 90.0 90.1 89.2 90.0  PLT 183 199 217 211 243 266   Basic Metabolic Panel: Recent Labs  Lab 11/08/22 0508 11/09/22 0416 11/10/22 0422 11/11/22 0415 11/11/22 1705 11/12/22 0919 11/13/22 0537 11/14/22 0442  NA 137   < > 138 140 137 141 138 136  K 4.5   < > 4.3 4.0 3.6 3.9 3.9 4.3  CL 110   < > 109 110 108 112* 107 107  CO2 18*   < > 19* 19* 20* 20* 21* 21*  GLUCOSE 88   < > 84 94 160* 86 92 87  BUN 28*   < > 29* 27* 28* 25* 28* 45*  CREATININE 3.82*   < > 3.78* 3.61* 3.66* 3.71* 3.98* 4.60*  CALCIUM 8.4*   < > 8.3* 8.5* 8.6* 8.8* 8.7* 8.6*  MG 1.8  --   --   --   --   --  2.1 2.4  PHOS  --   --  3.3 3.0  --   --   --   --    < > = values in this interval not displayed.   GFR: Estimated Creatinine Clearance: 22.1 mL/min (A) (by C-G formula based on SCr of 4.6 mg/dL (H)). Liver Function Tests: Recent Labs  Lab 11/07/22 2355  11/08/22 0508 11/10/22 0422 11/11/22 0415 11/12/22 0919 11/14/22 0900  AST 16 14*  --   --  32 25  ALT 17 16  --   --  34 42  ALKPHOS 119 108  --   --  87 81  BILITOT 1.0 0.9  --   --  0.6 0.3  PROT 7.3 6.7  --   --  7.2 6.6  ALBUMIN 3.9 3.6 3.2* 3.2* 3.5 3.3*   Recent Labs  Lab 11/07/22 2355 11/14/22 0900  LIPASE 85* 75*   No results for input(s): "AMMONIA" in the last 168 hours. Coagulation Profile: No results for input(s): "INR", "PROTIME" in the last 168 hours. Cardiac Enzymes: No results for input(s): "CKTOTAL", "CKMB", "  CKMBINDEX", "TROPONINI" in the last 168 hours. BNP (last 3 results) No results for input(s): "PROBNP" in the last 8760 hours. HbA1C: No results for input(s): "HGBA1C" in the last 72 hours. CBG: No results for input(s): "GLUCAP" in the last 168 hours. Lipid Profile: No results for input(s): "CHOL", "HDL", "LDLCALC", "TRIG", "CHOLHDL", "LDLDIRECT" in the last 72 hours. Thyroid Function Tests: No results for input(s): "TSH", "T4TOTAL", "FREET4", "T3FREE", "THYROIDAB" in the last 72 hours. Anemia Panel: No results for input(s): "VITAMINB12", "FOLATE", "FERRITIN", "TIBC", "IRON", "RETICCTPCT" in the last 72 hours. Sepsis Labs: Recent Labs  Lab 11/07/22 2355  LATICACIDVEN 0.9    Recent Results (from the past 240 hour(s))  SARS Coronavirus 2 by RT PCR (hospital order, performed in [facility] hospital lab) *cepheid single result test* Anterior Nasal Swab     Status: None   Collection Time: 11/12/22 11:00 AM   Specimen: Anterior Nasal Swab  Result Value Ref Range Status   SARS Coronavirus 2 by RT PCR NEGATIVE NEGATIVE Final    Comment: (NOTE) SARS-CoV-2 target nucleic acids are NOT DETECTED.  The SARS-CoV-2 RNA is generally detectable in upper and lower respiratory specimens during the acute phase of infection. The lowest concentration of SARS-CoV-2 viral copies this assay can detect is 250 copies / mL. A negative result does not preclude  SARS-CoV-2 infection and should not be used as the sole basis for treatment or other patient management decisions.  A negative result may occur with improper specimen collection / handling, submission of specimen other than nasopharyngeal swab, presence of viral mutation(s) within the areas targeted by this assay, and inadequate number of viral copies (<250 copies / mL). A negative result must be combined with clinical observations, patient history, and epidemiological information.  Fact Sheet for Patients:   RoadLapTop.co.za  Fact Sheet for Healthcare Providers: http://kim-miller.com/  This test is not yet approved or  cleared by the [place] FDA and has been authorized for detection and/or diagnosis of SARS-CoV-2 by FDA under an Emergency Use Authorization (EUA).  This EUA will remain in effect (meaning this test can be used) for the duration of the COVID-19 declaration under Section 564(b)(1) of the Act, 21 U.S.C. section 360bbb-3(b)(1), unless the authorization is terminated or revoked sooner.  Performed at [facility], [address] [place], [place] [address]   Respiratory (~20 pathogens) panel by PCR     Status: Abnormal   Collection Time: 11/12/22 11:33 AM   Specimen: Nasopharyngeal Swab; Respiratory  Result Value Ref Range Status   Adenovirus NOT DETECTED NOT DETECTED Final   Coronavirus 229E NOT DETECTED NOT DETECTED Final    Comment: (NOTE) The Coronavirus on the Respiratory Panel, DOES NOT test for the novel  Coronavirus (2019 nCoV)    Coronavirus HKU1 NOT DETECTED NOT DETECTED Final   Coronavirus NL63 NOT DETECTED NOT DETECTED Final   Coronavirus OC43 NOT DETECTED NOT DETECTED Final   Metapneumovirus NOT DETECTED NOT DETECTED Final   Rhinovirus / Enterovirus DETECTED (A) NOT DETECTED Final   Influenza A NOT DETECTED NOT DETECTED Final   Influenza B NOT DETECTED NOT DETECTED Final   Parainfluenza Virus 1 NOT  DETECTED NOT DETECTED Final   Parainfluenza Virus 2 NOT DETECTED NOT DETECTED Final   Parainfluenza Virus 3 NOT DETECTED NOT DETECTED Final   Parainfluenza Virus 4 NOT DETECTED NOT DETECTED Final   Respiratory Syncytial Virus NOT DETECTED NOT DETECTED Final   Bordetella pertussis NOT DETECTED NOT DETECTED Final   Bordetella Parapertussis NOT DETECTED NOT DETECTED  Final   Chlamydophila pneumoniae NOT DETECTED NOT DETECTED Final   Mycoplasma pneumoniae NOT DETECTED NOT DETECTED Final    Comment: Performed at [facility], [address] [place], [place] [address]         Radiology Studies: US RENAL  Result Date: 11/14/2022 CLINICAL DATA:  Acute kidney injury. EXAM: RENAL / URINARY TRACT ULTRASOUND COMPLETE COMPARISON:  CT abdomen and pelvis 11/09/2022, renal ultrasound 11/08/2022 FINDINGS: Right Kidney: Renal measurements: 7.1 x 3.7 x 3.3 cm = volume: 45 mL. Cortical atrophy is again seen. There is again mild fullness of the collecting system without significant hydronephrosis. Left Kidney: Renal measurements: 11.1 x 5.7 x 5.4 cm = volume: 180 mL. Unchanged left lower pole 4.9 x 4.7 x 5.0 cm simple cyst. No follow-up imaging is recommended. Bladder: Appears normal for degree of bladder distention. No ureteral jet was identified. Other: None. IMPRESSION: 1. No significant change compared to 11/08/2022. 2. Unchanged chronic right renal cortical atrophy and mild fullness of the right renal collecting system without significant hydronephrosis. 3. Unchanged left lower pole simple cyst.  No left hydronephrosis. Electronically Signed   By: Neita Garnet M.D.   On: 11/14/2022 11:25   ECHOCARDIOGRAM COMPLETE  Result Date: 11/12/2022    ECHOCARDIOGRAM REPORT   Patient Name:   CHANTZ LAHAIE Date of Exam: 11/12/2022 Medical Rec #:  [id number]     Height:       66.0 in Accession #:    [id number]    Weight:       169.8 lb Date of Birth:  1991/05/11     BSA:          1.865 m Patient Age:    32 years       BP:           168/113 mmHg Patient Gender: M             HR:           91 bpm. [facility] Procedure: 2D Echo, Cardiac Doppler and Color Doppler Indications:    Chest Pain R07.9  History:        Patient has no prior history of Echocardiogram examinations.                 Risk Factors:Tobacco use disorder. Pleuritic chest pain.  Sonographer:    Celesta Gentile RCS Referring Phys: [id number] Ieshia Hatcher D [facility] IMPRESSIONS  1. Left ventricular ejection fraction, by estimation, is 60 to 65%. The left ventricle has normal function. The left ventricle has no regional wall motion abnormalities. There is mild left ventricular hypertrophy. Left ventricular diastolic parameters were normal.  2. Right ventricular systolic function is normal. The right ventricular size is normal.  3. The mitral valve is normal in structure. No evidence of mitral valve regurgitation. No evidence of mitral stenosis.  4. The aortic valve is tricuspid. Aortic valve regurgitation is not visualized. No aortic stenosis is present.  5. The inferior vena cava is normal in size with greater than 50% respiratory variability, suggesting right atrial pressure of 3 mmHg. FINDINGS  Left Ventricle: Left ventricular ejection fraction, by estimation, is 60 to 65%. The left ventricle has normal function. The left ventricle has no regional wall motion abnormalities. The left ventricular internal cavity size was normal in size. There is  mild left ventricular hypertrophy. Left ventricular diastolic parameters were normal. Right Ventricle: The right ventricular size is normal. No increase in right ventricular wall thickness. Right ventricular  systolic function is normal. Left Atrium: Left atrial size was normal in size. Right Atrium: Right atrial size was normal in size. Pericardium: There is no evidence of pericardial effusion. Mitral Valve: The mitral valve is normal in structure. No evidence of mitral valve regurgitation. No evidence of mitral valve  stenosis. Tricuspid Valve: The tricuspid valve is normal in structure. Tricuspid valve regurgitation is not demonstrated. No evidence of tricuspid stenosis. Aortic Valve: The aortic valve is tricuspid. Aortic valve regurgitation is not visualized. No aortic stenosis is present. Pulmonic Valve: The pulmonic valve was normal in structure. Pulmonic valve regurgitation is not visualized. No evidence of pulmonic stenosis. Aorta: The aortic root is normal in size and structure. Venous: The inferior vena cava is normal in size with greater than 50% respiratory variability, suggesting right atrial pressure of 3 mmHg. IAS/Shunts: No atrial level shunt detected by color flow Doppler.  LEFT VENTRICLE PLAX 2D LVIDd:         4.00 cm   Diastology LVIDs:         2.20 cm   LV e' medial:    6.20 cm/s LV PW:         1.20 cm   LV E/e' medial:  16.6 LV IVS:        1.30 cm   LV e' lateral:   10.80 cm/s LVOT diam:     2.00 cm   LV E/e' lateral: 9.5 LV SV:         73 LV SV Index:   39 LVOT Area:     3.14 cm  RIGHT VENTRICLE RV S prime:     16.40 cm/s TAPSE (M-mode): 2.2 cm LEFT ATRIUM           Index        RIGHT ATRIUM           Index LA diam:      3.20 cm 1.72 cm/m   RA Area:     13.20 cm LA Vol (A2C): 22.4 ml 12.01 ml/m  RA Volume:   27.50 ml  14.74 ml/m LA Vol (A4C): 22.5 ml 12.06 ml/m  AORTIC VALVE LVOT Vmax:   140.00 cm/s LVOT Vmean:  90.500 cm/s LVOT VTI:    0.231 m  AORTA Ao Root diam: 3.20 cm MITRAL VALVE MV Area (PHT): 3.12 cm     SHUNTS MV Decel Time: 243 msec     Systemic VTI:  0.23 m MV E velocity: 103.00 cm/s  Systemic Diam: 2.00 cm MV A velocity: 74.80 cm/s MV E/A ratio:  1.38 Charlton Haws MD Electronically signed by Charlton Haws MD Signature Date/Time: 11/12/2022/4:10:32 PM    Final         Scheduled Meds:  amLODipine  10 mg Oral Daily   heparin  5,000 Units Subcutaneous Q8H   hydrALAZINE  50 mg Oral TID   isosorbide mononitrate  30 mg Oral Daily   pantoprazole  40 mg Oral Daily   predniSONE  20 mg Oral Q  breakfast   Ensure Max Protein  11 oz Oral Daily   sodium bicarbonate  650 mg Oral BID   sodium chloride flush  3 mL Intravenous Q12H   Continuous Infusions:  sodium chloride     lactated ringers 75 mL/hr at 11/14/22 1005     LOS: 1 day    Time spent: 35 minutes    Folasade Mooty Hoover Brunette, DO [facility]  If 7PM-7AM, please contact night-coverage [email or web address] 11/14/2022, 12:20 PM

## 2022-11-14 NOTE — Consult Note (Signed)
Nephrology Consult   Assessment/Recommendations:   AKI on CKD -true baseline Cr unknown, last known Cr was in 2012 and was 2.08. No new obstruction on ultrasound, likely working off on one functional kidney given known right renal atrophy which is likely a consequence of his history of nephrolithiasis. Serologies negative on most recent admit -agree with fluids today. Rise in Cr likely prerenal in nature -will repeat UA. Proteinuria work up as below -Avoid nephrotoxic medications including NSAIDs and iodinated intravenous contrast exposure unless the latter is absolutely indicated.  Preferred narcotic agents for pain control are hydromorphone, fentanyl, and methadone. Morphine should not be used. Avoid Baclofen and avoid oral sodium phosphate and magnesium citrate based laxatives / bowel preps. Continue strict Input and Output monitoring. Will monitor the patient closely with you and intervene or adjust therapy as indicated by changes in clinical status/labs   Chest pain, c/f pericarditis -pericarditis from rhinovirus? Doubt uremic in nature given that his BUN on readmit was only 28. CRP WNL -on prednisone 20 mg daily. Colchicine on hold -ANA negative  Elevated lipase, possible pancreatitis? -mgmt per primary service, increase LR to 150cc/hr. Symptom wise it seems like he does have pancreatitis  Proteinuria -Greater than 300 protein on UA on 6/23. 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 Seems to be a chronic issue as per patient as he was told about this in prison in the past -Repeat UA -Will start with checking a UPC and UACR and expand work up from there if needed  Rhinovirus -per primary  HTN -on norvasc, hydralazine, imdur. Can titrate meds accordingly  Metabolic acidosis -on nahco3 supplementation  Tobacco abuse -cessation counseling per primary service  Recommendations conveyed to primary service.    Anthony Sar [facility] 11/14/2022 12:08  PM   _____________________________________________________________________________________   History of Present Illness: Manuel Riggs is a/an 32 y.o. male with a past medical history of CAD, history of nephrolithiasis with right renal atrophy, hypertension, tobacco abuse marijuana use who presents to back to [facility] with chest pain.  He was just admitted from 6/23 to 6/27 for hypertensive crisis and found to have AKI creatinine up to 4.2 on admission, down to 3.6 on discharge.  We were consulted during that admission his renal ultrasound did show atrophic right kidney.  On the day he was discharged, he had complaints of chest pain and came right back to the ER.  EKG showed evidence of LVH and diffuse T wave inversions along the precordial leads, concern for pericarditis therefore on colchicine and prednisone now.  His creatinine was stable at 3.6 but now up to 4.6 today-hence consultation.  Renal ultrasound today did not show any significant changes compared to 6/24.  His lipase however was slightly elevated therefore being treated as pancreatitis now. He did have a syncopal episode with chest pain, coughing at the time, probably vasovagal per primary. Patient seen and examined bedside. He reports that he is peeing a lot. He does report that he was told that he has protein in his urine years ago when he was in prison. Does have RUQ/epigastric pain (based on where he's pointing to) with eating. Has pain in his right lower chest every time he coughs. Denies any SOB, swelling, dysuria, hematuria. Denies any alcohol use.   Medications:  Current Facility-Administered Medications  Medication Dose Route Frequency Provider Last Rate Last Admin   0.9 %  sodium chloride infusion  250 mL Intravenous PRN Sherryll Burger, Pratik D, DO  acetaminophen (TYLENOL) tablet 650 mg  650 mg Oral Q6H PRN Maurilio Lovely D, DO   650 mg at 11/13/22 2321   Or   acetaminophen (TYLENOL) suppository 650 mg  650 mg Rectal Q6H PRN Sherryll Burger,  Pratik D, DO       ALPRAZolam Prudy Feeler) tablet 0.25 mg  0.25 mg Oral TID PRN Sherryll Burger, Pratik D, DO   0.25 mg at 11/13/22 1851   amLODipine (NORVASC) tablet 10 mg  10 mg Oral Daily Sherryll Burger, Pratik D, DO   10 mg at 11/14/22 0816   guaiFENesin (ROBITUSSIN) 100 MG/5ML liquid 5 mL  5 mL Oral Q4H PRN Maurilio Lovely D, DO   5 mL at 11/13/22 0541   heparin injection 5,000 Units  5,000 Units Subcutaneous Q8H Shah, Pratik D, DO   5,000 Units at 11/14/22 1610   hydrALAZINE (APRESOLINE) tablet 50 mg  50 mg Oral TID Maurilio Lovely D, DO   50 mg at 11/14/22 9604   HYDROmorphone (DILAUDID) injection 0.5 mg  0.5 mg Intravenous Q3H PRN Maurilio Lovely D, DO   0.5 mg at 11/14/22 5409   isosorbide mononitrate (IMDUR) 24 hr tablet 30 mg  30 mg Oral Daily Sherryll Burger, Pratik D, DO   30 mg at 11/14/22 8119   lactated ringers infusion   Intravenous Continuous Maurilio Lovely D, DO 75 mL/hr at 11/14/22 1005 New Bag at 11/14/22 1005   ondansetron (ZOFRAN) tablet 4 mg  4 mg Oral Q8H PRN Maurilio Lovely D, DO   4 mg at 11/13/22 1202   pantoprazole (PROTONIX) EC tablet 40 mg  40 mg Oral Daily Maurilio Lovely D, DO   40 mg at 11/14/22 1478   predniSONE (DELTASONE) tablet 20 mg  20 mg Oral Q breakfast Maurilio Lovely D, DO   20 mg at 11/14/22 2956   protein supplement (ENSURE MAX) liquid  11 oz Oral Daily Maurilio Lovely D, DO   11 oz at 11/14/22 2130   sodium bicarbonate tablet 650 mg  650 mg Oral BID Maurilio Lovely D, DO   650 mg at 11/14/22 8657   sodium chloride flush (NS) 0.9 % injection 3 mL  3 mL Intravenous Q12H Shah, Pratik D, DO   3 mL at 11/14/22 8469   sodium chloride flush (NS) 0.9 % injection 3 mL  3 mL Intravenous PRN Sherryll Burger, Pratik D, DO         ALLERGIES Aspirin  MEDICAL HISTORY Past Medical History:  Diagnosis Date   Chronic back pain    Chronic leg pain    Kidney calculi      SOCIAL HISTORY Social History   Socioeconomic History   Marital status: Single    Spouse name: Not on file   Number of children: Not on file   Years of  education: Not on file   Highest education level: Not on file  Occupational History   Not on file  Tobacco Use   Smoking status: Every Day    Types: Cigarettes   Smokeless tobacco: Not on file  Substance and Sexual Activity   Alcohol use: No   Drug use: Yes    Types: Marijuana   Sexual activity: Not Currently  Other Topics Concern   Not on file  Social History Narrative   Not on file   Social Determinants of Health   Financial Resource Strain: Not on file  Food Insecurity: No Food Insecurity (11/12/2022)   Hunger Vital Sign    Worried About Running Out of Food in the Last  Year: Never true    Ran Out of Food in the Last Year: Never true  Transportation Needs: No Transportation Needs (11/12/2022)   PRAPARE - Administrator, Civil Service (Medical): No    Lack of Transportation (Non-Medical): No  Physical Activity: Not on file  Stress: Not on file  Social Connections: Not on file  Intimate Partner Violence: Not At Risk (11/12/2022)   Humiliation, Afraid, Rape, and Kick questionnaire    Fear of Current or Ex-Partner: No    Emotionally Abused: No    Physically Abused: No    Sexually Abused: No     FAMILY HISTORY Family History  Problem Relation Age of Onset   Hypertension Mother     Review of Systems: 12 systems reviewed Otherwise as per HPI, all other systems reviewed and negative  Physical Exam: Vitals:   11/14/22 0502 11/14/22 0816  BP: 132/86 (!) 168/101  Pulse: 90   Resp: 18   Temp: 98 F (36.7 C)   SpO2: 98%    Total I/O In: 240 [P.O.:240] Out: -   Intake/Output Summary (Last 24 hours) at 11/14/2022 1208 Last data filed at 11/14/2022 0900 Gross per 24 hour  Intake 720 ml  Output --  Net 720 ml   General: well-appearing, no acute distress HEENT: anicteric sclera, oropharynx clear without lesions CV: regular rate, normal rhythm, no murmurs, no gallops, no rubs Lungs: clear to auscultation bilaterally, normal work of breathing Abd: TTP  RUQ/right lower chest & epigastric area, minimal tenderness in bilateral lower quadrants, soft, no guarding Skin: no visible lesions or rashes Psych: alert, engaged, appropriate mood and affect Musculoskeletal: no obvious deformities Neuro: normal speech, no gross focal deficits   Test Results Reviewed Lab Results  Component Value Date   NA 136 11/14/2022   K 4.3 11/14/2022   CL 107 11/14/2022   CO2 21 (L) 11/14/2022   BUN 45 (H) 11/14/2022   CREATININE 4.60 (H) 11/14/2022   CALCIUM 8.6 (L) 11/14/2022   ALBUMIN 3.3 (L) 11/14/2022   PHOS 3.0 11/11/2022     I have reviewed all relevant outside healthcare records related to the patient's kidney injury.

## 2022-11-15 DIAGNOSIS — R0781 Pleurodynia: Secondary | ICD-10-CM | POA: Diagnosis not present

## 2022-11-15 LAB — COMPREHENSIVE METABOLIC PANEL
ALT: 40 U/L (ref 0–44)
AST: 22 U/L (ref 15–41)
Albumin: 3 g/dL — ABNORMAL LOW (ref 3.5–5.0)
Alkaline Phosphatase: 74 U/L (ref 38–126)
Anion gap: 9 (ref 5–15)
BUN: 48 mg/dL — ABNORMAL HIGH (ref 6–20)
CO2: 23 mmol/L (ref 22–32)
Calcium: 8.6 mg/dL — ABNORMAL LOW (ref 8.9–10.3)
Chloride: 104 mmol/L (ref 98–111)
Creatinine, Ser: 4.26 mg/dL — ABNORMAL HIGH (ref 0.61–1.24)
GFR, Estimated: 18 mL/min — ABNORMAL LOW (ref >60)
Glucose, Bld: 81 mg/dL (ref 70–99)
Potassium: 4.7 mmol/L (ref 3.5–5.1)
Sodium: 136 mmol/L (ref 135–145)
Total Bilirubin: 0.4 mg/dL (ref 0.3–1.2)
Total Protein: 5.9 g/dL — ABNORMAL LOW (ref 6.5–8.1)

## 2022-11-15 LAB — LIPID PANEL
Cholesterol: 205 mg/dL — ABNORMAL HIGH (ref 0–200)
HDL: 51 mg/dL (ref >40)
LDL Cholesterol: 135 mg/dL — ABNORMAL HIGH (ref 0–99)
Total CHOL/HDL Ratio: 4 RATIO
Triglycerides: 97 mg/dL (ref <150)
VLDL: 19 mg/dL (ref 0–40)

## 2022-11-15 LAB — MAGNESIUM: Magnesium: 2.2 mg/dL (ref 1.7–2.4)

## 2022-11-15 LAB — LIPASE, BLOOD: Lipase: 31 U/L (ref 11–51)

## 2022-11-15 LAB — CBC
HCT: 34.7 % — ABNORMAL LOW (ref 39.0–52.0)
Hemoglobin: 11.4 g/dL — ABNORMAL LOW (ref 13.0–17.0)
MCH: 29.2 pg (ref 26.0–34.0)
MCHC: 32.9 g/dL (ref 30.0–36.0)
MCV: 89 fL (ref 80.0–100.0)
Platelets: 285 10*3/uL (ref 150–400)
RBC: 3.9 MIL/uL — ABNORMAL LOW (ref 4.22–5.81)
RDW: 13.8 % (ref 11.5–15.5)
WBC: 9.3 10*3/uL (ref 4.0–10.5)
nRBC: 0 % (ref 0.0–0.2)

## 2022-11-15 LAB — MICROALBUMIN / CREATININE URINE RATIO
Creatinine, Urine: 64.1 mg/dL
Microalb Creat Ratio: 1164 mg/g creat — ABNORMAL HIGH (ref 0–29)
Microalb, Ur: 746.2 ug/mL — ABNORMAL HIGH

## 2022-11-15 MED ORDER — HYDROMORPHONE HCL 1 MG/ML IJ SOLN
0.5000 mg | INTRAMUSCULAR | Status: DC | PRN
  Administered 2022-11-15 – 2022-11-16 (×3): 0.5 mg via INTRAVENOUS
  Filled 2022-11-15 (×3): qty 0.5

## 2022-11-15 NOTE — Progress Notes (Signed)
Patient ID: Manuel Riggs, male   DOB: [birth year], 32 y.o.   MRN: [id number] [facility] Progress Note   Assessment/ Plan:   1. Acute kidney Injury on chronic kidney disease stage IV: With a solitary functioning left kidney status post right renal injury/atrophy from staghorn calculus and obstruction.  Current injury suspected to be hemodynamically mediated in the setting of acute pancreatitis and improving with intravenous fluids.  The paradoxical rise of BUN is likely from prednisone use (for management of pericarditis).  Urine output not charted from overnight.  Based on patient account likely around 2 L over the past 24 hours.  He does not have any acute electrolyte abnormality to prompt intervention and the plan is to continue intravenous fluids the next 24 hours with continued monitoring of labs and transition to oral fluid if renal function continues to improve.  He already has an outpatient nephrology follow-up at the [facility] CKD clinic on 7/15. 2.  Chest pain: Suspected pericarditis versus musculoskeletal chest pain with negative ESR/CRP.  Ongoing additional evaluation by cardiology and continue prednisone. 3.  Hypertension: Blood pressures currently acceptable with intermittent elevations associated with pain. 4.  Acute pancreatitis: With rapid improvement of lipase level noted overnight following conservative management; low index of suspicion.  Subjective:   Reports of paroxysms of cough associated with presyncopal symptoms.  Still having some pleuritic type substernal chest pain that worsens with cough.   Objective:   BP 123/84 (BP Location: Right Arm)   Pulse 72   Temp 98.7 F (37.1 C) (Oral)   Resp 19   Ht 5\' 6"  (1.676 m)   Wt 77 kg   SpO2 98%   BMI 27.40 kg/m   Intake/Output Summary (Last 24 hours) at 11/15/2022 0827 Last data filed at 11/15/2022 4098 Gross per 24 hour  Intake 1262.17 ml  Output --  Net 1262.17 ml   Weight change:   Physical Exam: Gen:  Comfortably resting in bed, watching television CVS: Pulse regular rhythm, normal rate, S1 and S2 normal Resp: Diminished breath sounds over bases with poor inspiratory effort, no rales/rhonchi Abd: Soft, flat, mild tenderness over lower left quadrant, bowel sounds normal Ext: No lower extremity edema  Imaging: US RENAL  Result Date: 11/14/2022 CLINICAL DATA:  Acute kidney injury. EXAM: RENAL / URINARY TRACT ULTRASOUND COMPLETE COMPARISON:  CT abdomen and pelvis 11/09/2022, renal ultrasound 11/08/2022 FINDINGS: Right Kidney: Renal measurements: 7.1 x 3.7 x 3.3 cm = volume: 45 mL. Cortical atrophy is again seen. There is again mild fullness of the collecting system without significant hydronephrosis. Left Kidney: Renal measurements: 11.1 x 5.7 x 5.4 cm = volume: 180 mL. Unchanged left lower pole 4.9 x 4.7 x 5.0 cm simple cyst. No follow-up imaging is recommended. Bladder: Appears normal for degree of bladder distention. No ureteral jet was identified. Other: None. IMPRESSION: 1. No significant change compared to 11/08/2022. 2. Unchanged chronic right renal cortical atrophy and mild fullness of the right renal collecting system without significant hydronephrosis. 3. Unchanged left lower pole simple cyst.  No left hydronephrosis. Electronically Signed   By: Neita Garnet M.D.   On: 11/14/2022 11:25    Labs: BMET Recent Labs  Lab 11/10/22 0422 11/11/22 0415 11/11/22 1705 11/12/22 0919 11/13/22 0537 11/14/22 0442 11/15/22 0420  NA 138 140 137 141 138 136 136  K 4.3 4.0 3.6 3.9 3.9 4.3 4.7  CL 109 110 108 112* 107 107 104  CO2 19* 19* 20* 20* 21* 21* 23  GLUCOSE 84  94 160* 86 92 87 81  BUN 29* 27* 28* 25* 28* 45* 48*  CREATININE 3.78* 3.61* 3.66* 3.71* 3.98* 4.60* 4.26*  CALCIUM 8.3* 8.5* 8.6* 8.8* 8.7* 8.6* 8.6*  PHOS 3.3 3.0  --   --   --   --   --    CBC Recent Labs  Lab 11/10/22 0701 11/11/22 1705 11/12/22 0919 11/13/22 0537 11/14/22 0442 11/15/22 0420  WBC 6.1   < > 5.5 6.7  9.1 9.3  NEUTROABS 4.3  --   --   --   --   --   HGB 12.7*   < > 13.8 13.0 12.8* 11.4*  HCT 39.3   < > 42.8 39.5 39.5 34.7*  MCV 90.1   < > 90.1 89.2 90.0 89.0  PLT 199   < > 211 243 266 285   < > = values in this interval not displayed.    Medications:     amLODipine  10 mg Oral Daily   heparin  5,000 Units Subcutaneous Q8H   hydrALAZINE  50 mg Oral TID   isosorbide mononitrate  30 mg Oral Daily   pantoprazole  40 mg Oral Daily   predniSONE  20 mg Oral Q breakfast   Ensure Max Protein  11 oz Oral Daily   sodium bicarbonate  650 mg Oral BID   sodium chloride flush  3 mL Intravenous Q12H    Zetta Bills, MD 11/15/2022, 8:27 AM

## 2022-11-15 NOTE — Hospital Course (Signed)
AZZIE CLAYBAUGH is a 32 y.o. male with medical history significant for obstructive nephrolithiasis (s/p nephrostomy tubes 2012) complicated by CKD, HTN, and tobacco use disorder, who presents with chest pain.   Mr Boers was admitted from 6/23-6/27 for AKI, iso likely acute viral illness, with Cr at 4.2, trended to 3.61 by discharge. Hospital stay significant for Renal U/S which revealed an atrophic right kidney. His course was complicated by hypertensive crisis and was discharged with Norvasc 10mg , Hydralazine 50mg  TID, Isosorbide 30mg .   Pt presented to ED with CP and a syncopal episode. He had a V/Q scan which was negative for PE. He had troponins drawn x2 until 7p on 6/27 which peaked at 46. EKG significant for sinus tachycardia and some T wave abnormalities.   6/28 Echo without pericardial effusion, LVEF 60-65%, and LV hypertrophy. ESR peaked at 27, now down to 16 and wnl. Chest pain overall improving on 20mg  Prednisone (favored over Colchicine due to low eGFR <20).  Admission has been complicated by AKI on CKD with Cr up 3.71>4.60. Nephrology following, likely pre-renal, and gave IVF in the setting of increased Lipase and concern for mild/developing pancreatitis with nausea and vomiting over the weekend. Cr and Lipase improving today, 7/1 alongside improvement in nausea and vomiting.

## 2022-11-15 NOTE — Progress Notes (Signed)
   11/15/22 1100  Vitals  BP (!) 142/82  MAP (mmHg) 98  BP Location Right Arm  BP Method Automatic  Patient Position (if appropriate) Lying  Pulse Rate (!) 127  Pulse Rate Source Monitor  Resp 18  Level of Consciousness  Level of Consciousness Alert  MEWS COLOR  MEWS Score Color Yellow  Oxygen Therapy  SpO2 97 %  O2 Device Room Air  Pain Assessment  Pain Scale 0-10  Pain Score 10  Pain Type Acute pain  Pain Location Abdomen  Pain Orientation Right;Lower;Upper  Pain Descriptors / Indicators Stabbing;Aching  Pain Frequency Constant  Pain Onset On-going  Patients Stated Pain Goal 4  Pain Intervention(s) Medication (See eMAR)  PAINAD (Pain Assessment in Advanced Dementia)  Breathing 0  Negative Vocalization 0  Facial Expression 2  Body Language 0  Glasgow Coma Scale  Eye Opening 4  Best Verbal Response (NON-intubated) 5  Best Motor Response 6  Glasgow Coma Scale Score 15  MEWS Score  MEWS Temp 0  MEWS Systolic 0  MEWS Pulse 2  MEWS RR 0  MEWS LOC 0  MEWS Score 2  Provider Notification  Provider Name/Title Sherryll Burger  Date Provider Notified 11/15/22  Time Provider Notified 1200  Method of Notification Page  Notification Reason Other (Comment)

## 2022-11-15 NOTE — Progress Notes (Addendum)
Rounding Note    Patient Name: Manuel Riggs Date of Encounter: 11/15/2022  [facility] Cardiologist: New  Subjective   CRP and Sed rate were normal. Patient reports improved but dull right sided chest pain and lower abdominal pain.   Inpatient Medications    Scheduled Meds:  amLODipine  10 mg Oral Daily   heparin  5,000 Units Subcutaneous Q8H   hydrALAZINE  50 mg Oral TID   isosorbide mononitrate  30 mg Oral Daily   pantoprazole  40 mg Oral Daily   predniSONE  20 mg Oral Q breakfast   Ensure Max Protein  11 oz Oral Daily   sodium bicarbonate  650 mg Oral BID   sodium chloride flush  3 mL Intravenous Q12H   Continuous Infusions:  sodium chloride     lactated ringers 150 mL/hr at 11/15/22 0648   PRN Meds: sodium chloride, acetaminophen **OR** acetaminophen, ALPRAZolam, guaiFENesin, HYDROmorphone (DILAUDID) injection, ondansetron, sodium chloride flush   Vital Signs    Vitals:   11/14/22 1500 11/14/22 2048 11/14/22 2246 11/15/22 0443  BP: 135/79 125/78 121/79 123/84  Pulse: 97 88 80 72  Resp:  20  19  Temp: 98.7 F (37.1 C) 98.8 F (37.1 C)  98.7 F (37.1 C)  TempSrc: Oral Oral  Oral  SpO2: 98% 97%  98%  Weight:      Height:        Intake/Output Summary (Last 24 hours) at 11/15/2022 0744 Last data filed at 11/15/2022 0336 Gross per 24 hour  Intake 1262.17 ml  Output --  Net 1262.17 ml      11/12/2022   12:00 PM 11/07/2022   11:25 PM 11/07/2022   11:03 PM  Last 3 Weights  Weight (lbs) 169 lb 12.1 oz 164 lb 6.4 oz 189 lb  Weight (kg) 77 kg 74.571 kg 85.73 kg      Telemetry    NSR HR 60s - Personally Reviewed  ECG    No new - Personally Reviewed  Physical Exam   GEN: No acute distress.   Neck: No JVD Cardiac: RRR, no murmurs, rubs, or gallops.  Respiratory: Clear to auscultation bilaterally. GI: Soft, nontender, non-distended  MS: No edema; No deformity. Neuro:  Nonfocal  Psych: Normal affect   Labs    High Sensitivity Troponin:    Recent Labs  Lab 11/07/22 2355 11/11/22 1705 11/11/22 1955 11/12/22 0742 11/12/22 0922  TROPONINIHS 15 29* 46* 23* 20*     Chemistry Recent Labs  Lab 11/12/22 0919 11/13/22 0537 11/14/22 0442 11/14/22 0900 11/15/22 0420  NA 141 138 136  --  136  K 3.9 3.9 4.3  --  4.7  CL 112* 107 107  --  104  CO2 20* 21* 21*  --  23  GLUCOSE 86 92 87  --  81  BUN 25* 28* 45*  --  48*  CREATININE 3.71* 3.98* 4.60*  --  4.26*  CALCIUM 8.8* 8.7* 8.6*  --  8.6*  MG  --  2.1 2.4  --  2.2  PROT 7.2  --   --  6.6 5.9*  ALBUMIN 3.5  --   --  3.3* 3.0*  AST 32  --   --  25 22  ALT 34  --   --  42 40  ALKPHOS 87  --   --  81 74  BILITOT 0.6  --   --  0.3 0.4  GFRNONAA 21* 19* 16*  --  18*  ANIONGAP 9 10 8   --  9    Lipids No results for input(s): "CHOL", "TRIG", "HDL", "LABVLDL", "LDLCALC", "CHOLHDL" in the last 168 hours.  Hematology Recent Labs  Lab 11/13/22 0537 11/14/22 0442 11/15/22 0420  WBC 6.7 9.1 9.3  RBC 4.43 4.39 3.90*  HGB 13.0 12.8* 11.4*  HCT 39.5 39.5 34.7*  MCV 89.2 90.0 89.0  MCH 29.3 29.2 29.2  MCHC 32.9 32.4 32.9  RDW 13.9 13.5 13.8  PLT 243 266 285   Thyroid No results for input(s): "TSH", "FREET4" in the last 168 hours.  BNP Recent Labs  Lab 11/11/22 1705  BNP 68.0    DDimer No results for input(s): "DDIMER" in the last 168 hours.   Radiology    US RENAL  Result Date: 11/14/2022 CLINICAL DATA:  Acute kidney injury. EXAM: RENAL / URINARY TRACT ULTRASOUND COMPLETE COMPARISON:  CT abdomen and pelvis 11/09/2022, renal ultrasound 11/08/2022 FINDINGS: Right Kidney: Renal measurements: 7.1 x 3.7 x 3.3 cm = volume: 45 mL. Cortical atrophy is again seen. There is again mild fullness of the collecting system without significant hydronephrosis. Left Kidney: Renal measurements: 11.1 x 5.7 x 5.4 cm = volume: 180 mL. Unchanged left lower pole 4.9 x 4.7 x 5.0 cm simple cyst. No follow-up imaging is recommended. Bladder: Appears normal for degree of bladder distention.  No ureteral jet was identified. Other: None. IMPRESSION: 1. No significant change compared to 11/08/2022. 2. Unchanged chronic right renal cortical atrophy and mild fullness of the right renal collecting system without significant hydronephrosis. 3. Unchanged left lower pole simple cyst.  No left hydronephrosis. Electronically Signed   By: Neita Garnet M.D.   On: 11/14/2022 11:25    Cardiac Studies   Echo 11/12/22   1. Left ventricular ejection fraction, by estimation, is 60 to 65%. The  left ventricle has normal function. The left ventricle has no regional  wall motion abnormalities. There is mild left ventricular hypertrophy.  Left ventricular diastolic parameters  were normal.   2. Right ventricular systolic function is normal. The right ventricular  size is normal.   3. The mitral valve is normal in structure. No evidence of mitral valve  regurgitation. No evidence of mitral stenosis.   4. The aortic valve is tricuspid. Aortic valve regurgitation is not  visualized. No aortic stenosis is present.   5. The inferior vena cava is normal in size with greater than 50%  respiratory variability, suggesting right atrial pressure of 3 mmHg.   Patient Profile     32 y.o. male with a history of CKD stage 3, prior staghorn calculus requiring nephrostomy tubes in 2012, HTN who is being seen for chest pain.   Assessment & Plan    Atypical chest pain/possible pericarditis - reported episodes of waxing and waning chest pain, similar to recent admission - HS trop is mildly elevated, peaking at 46. EKG showed diffuse TWI along precordial leads - Chest CT negative for PE - Echo showed LVEF 60-5%, no WMA, mild LVH, normal RVSF - Sed rate and CRP wnl - colchicine held given AKI - continue steroids - patient says chest pain is improving but still present  Syncope - reported near syncope and possible syncope after taking a muscle relaxer - suspect vasovagal - tele shows NSR - will need heart  monitor at discharge  HTN - Bps are good - continue amlodipine 10mg  daily, Hydralazine 50mg  TID and Imdur 30mg  daily  CKD stage 3-4 - Scr 4.2 today - unclear baseline, Scr  2.08 in 2012 - nephrology has been consulted - s/p IVF  For questions or updates, please contact [facility] Please consult [email or web address] for contact info under        Signed, Cadence David Stall, PA-C  11/15/2022, 7:44 AM     Patient seen and discussed with PA Fransico Michael, I agree with her documentation.      1.Atypical chest pain/suspected pericarditis -Troponin values are mildly elevated, peaking at 46 and EKG does show diffuse T wave inversion along the precordial leads  - echo: LVEF 60-65%, no WMAs, no effusion.  ESR initially peak 25, CRP 0.5 - CXR no acute process - VQ scan no PE   - suspected pericarditis. Does report a viral illness about 1 week ago, PCR respiratory panel was positive for rhinovirus.  - Avoid NSAIDs with poor renal function.  - Started on colchcine but stopped due to very low GFR - started on prednisone 20mg  daily. If asymptomatic and normal inflammatory markers at 2 weeks would slowly wean over time. High risk of recurrence given inability to use either NSAIDs or colchcine. Literature varies quite a bit on suggested tapering regimens, likely would plan  slow gradual taper, perhaps 2.5 to 5mg  decrease every 2 weeks if remains asymptomatic and CRP negative prior to each adjustement.  - some ongoing symptoms though improving daily   2.AKI - admit Cr 4.2, BUN 29   3.Vasovagal syncope - no further workup      Dina Rich MD

## 2022-11-15 NOTE — Progress Notes (Signed)
PROGRESS NOTE    Manuel Riggs  FAO:[id number] DOB: [birth year] DOA: 11/11/2022 PCP: Patient, No Pcp Per   Brief Narrative:  Manuel Riggs is a 32 y.o. male with medical history significant for obstructive nephrolithiasis (s/p nephrostomy tubes 2012) complicated by CKD, HTN, and tobacco use disorder, who presents with chest pain.   Manuel Riggs was admitted from 6/23-6/27 for AKI, iso likely acute viral illness, with Cr at 4.2, trended to 3.61 by discharge. Hospital stay significant for Renal U/S which revealed an atrophic right kidney. His course was complicated by hypertensive crisis and was discharged with Norvasc 10mg , Hydralazine 50mg  TID, Isosorbide 30mg .   Pt presented to ED with CP and a syncopal episode. He had a V/Q scan which was negative for PE. He had troponins drawn x2 until 7p on 6/27 which peaked at 46. EKG significant for sinus tachycardia and some T wave abnormalities.   6/28 Echo without pericardial effusion, LVEF 60-65%, and LV hypertrophy. ESR peaked at 27, now down to 16 and wnl. Chest pain overall improving on 20mg  Prednisone (favored over Colchicine due to low eGFR <20).  Admission has been complicated by AKI on CKD with Cr up 3.71>4.60. Nephrology following, likely pre-renal, and gave IVF in the setting of increased Lipase and concern for mild/developing pancreatitis with nausea and vomiting over the weekend. Cr and Lipase improving today, 7/1 alongside improvement in nausea and vomiting.    Assessment & Plan:   Principal Problem:   Pleuritic chest pain Active Problems:   Nonspecific abnormal electrocardiogram (ECG) (EKG)   Acute idiopathic pericarditis   Chest pain on breathing   Pericarditis  Assessment and Plan: No notes have been filed under this hospital service. Service: Hospitalist  Chest pain c/f pericarditis Likely 2/2 Rhinovirus. PE has been ruled out with 6/27 V/Q scan. ACS has been ruled out with EKG (sinus tachy with diffuse T wave inversions) and  trended Trops 29>46>23. Positive for Rhinovirus and endorses worsened CP when sitting up and leaning forward, both of which increased suspicion for a viral pericarditis (BUN at 48, no other uremic symptoms). 6/28 Echo without pericardial effusion, normal LVEF. Chest pain improving overall alongside Prednisone with normalized ESR; CRP continues to be low at 0.5. Avoiding Colchicine for GFR<20, continuing 20mg  Prednisone. - Continue prednisone 20 mg daily and follow ESR and CRP - Appreciate cardiology recommendations   HTN BP has been wnl over last 24 hours and is now well controlled on current regimen. 6/28 Echo with LV hypertrophy, discussed importance of long term BP management as outpatient with Manuel Riggs. - Amlodipine 10mg  - Hydralazine 50mg  TID - Imdur 30mg    AKI on CKD III-IV Pt with history of obstructive nephrolithiasis with staghorn calculus in 2012 which was treated with temporary nephrostomy tubes. His prior admission last week (6/23-6/27) for AKI with Cr up to 4.2>3.6 was evaluated by nephrology and deemed appropriate for further outpatient management, likely with urology. His renal U/S revealed an atrophic right kidney, and his new Cr baseline is certainly still settling out on the heels of this very recent discharge. Cr on this admission trended up 4.6 iso n/v, improved to 4.26 after IVF. No uremic symptoms or signs of volume overload, K is wnl but has been trending up 3.6>4.7. - Continue mIVF at 180ml/hr LR - Will continue to monitor - Avoid nephrotoxic medications - Appreciate nephrology evaluation - Manuel Riggs will see Nephrology as outpatient [facility] on 7/15   Syncopal episode c/f vasovagal Manuel Riggs syncopal  episode was accompanied by a prodrome of lightheadedness and a sensation that he was about to "pass out," and succeeded by nausea. His history of discomfort and fear due to his palpitations also indicate his syncopal episode was most likely vasovagal. No  evidence of concerning arrhythmia on EKG. Witnessed fall without concern for head injury.  - Will continue to monitor   Abdominal pain with nausea and lipase elevation suspect mild pancreatitis No episodes of vomiting today, lipase normalized 75>31, and denies further nausea. Will advance to clear liquids today. - Continue to follow a.m. labs - Had recent CT scan as well as ultrasound with no gallstones noted - mIVF as above, advancing to clear liquids today   DVT prophylaxis:Heparin Code Status: Full Family Communication: None at bedside   Consultants:  Cardiology Nephrology  Procedures:  None  Antimicrobials:  None   Subjective: Patient seen and evaluated today with no new acute complaints or concerns. No acute concerns or events noted overnight. He continues to have some mild chest pain, but notes this is overall improving. Feels hungry, denies further nausea, denies vomiting.  Objective: Vitals:   11/14/22 1500 11/14/22 2048 11/14/22 2246 11/15/22 0443  BP: 135/79 125/78 121/79 123/84  Pulse: 97 88 80 72  Resp:  20  19  Temp: 98.7 F (37.1 C) 98.8 F (37.1 C)  98.7 F (37.1 C)  TempSrc: Oral Oral  Oral  SpO2: 98% 97%  98%  Weight:      Height:        Intake/Output Summary (Last 24 hours) at 11/15/2022 0929 Last data filed at 11/15/2022 0336 Gross per 24 hour  Intake 1022.17 ml  Output --  Net 1022.17 ml   Filed Weights   11/12/22 1200  Weight: 77 kg    Examination:  General exam: Appears calm and comfortable  Respiratory system: Clear to auscultation. Respiratory effort normal. Cardiovascular system: S1 & S2 heard, RRR, no friction rub Gastrointestinal system: Abdomen with mild guarding, tender to palpation periumbilical Central nervous system: Alert and awake Extremities: No edema Skin: No significant lesions noted Psychiatry: Flat affect.    Data Reviewed: I have personally reviewed following labs and imaging studies  CBC: Recent Labs  Lab  11/10/22 0701 11/11/22 1705 11/12/22 0919 11/13/22 0537 11/14/22 0442 11/15/22 0420  WBC 6.1 7.7 5.5 6.7 9.1 9.3  NEUTROABS 4.3  --   --   --   --   --   HGB 12.7* 12.6* 13.8 13.0 12.8* 11.4*  HCT 39.3 38.0* 42.8 39.5 39.5 34.7*  MCV 90.1 90.0 90.1 89.2 90.0 89.0  PLT 199 217 211 243 266 285   Basic Metabolic Panel: Recent Labs  Lab 11/10/22 0422 11/11/22 0415 11/11/22 1705 11/12/22 0919 11/13/22 0537 11/14/22 0442 11/15/22 0420  NA 138 140 137 141 138 136 136  K 4.3 4.0 3.6 3.9 3.9 4.3 4.7  CL 109 110 108 112* 107 107 104  CO2 19* 19* 20* 20* 21* 21* 23  GLUCOSE 84 94 160* 86 92 87 81  BUN 29* 27* 28* 25* 28* 45* 48*  CREATININE 3.78* 3.61* 3.66* 3.71* 3.98* 4.60* 4.26*  CALCIUM 8.3* 8.5* 8.6* 8.8* 8.7* 8.6* 8.6*  MG  --   --   --   --  2.1 2.4 2.2  PHOS 3.3 3.0  --   --   --   --   --    GFR: Estimated Creatinine Clearance: 23.9 mL/min (A) (by C-G formula based on SCr of 4.26 mg/dL (  H)). Liver Function Tests: Recent Labs  Lab 11/10/22 0422 11/11/22 0415 11/12/22 0919 11/14/22 0900 11/15/22 0420  AST  --   --  32 25 22  ALT  --   --  34 42 40  ALKPHOS  --   --  87 81 74  BILITOT  --   --  0.6 0.3 0.4  PROT  --   --  7.2 6.6 5.9*  ALBUMIN 3.2* 3.2* 3.5 3.3* 3.0*   Recent Labs  Lab 11/14/22 0900 11/15/22 0420  LIPASE 75* 31   No results for input(s): "AMMONIA" in the last 168 hours. Coagulation Profile: No results for input(s): "INR", "PROTIME" in the last 168 hours. Cardiac Enzymes: No results for input(s): "CKTOTAL", "CKMB", "CKMBINDEX", "TROPONINI" in the last 168 hours. BNP (last 3 results) No results for input(s): "PROBNP" in the last 8760 hours. HbA1C: No results for input(s): "HGBA1C" in the last 72 hours. CBG: No results for input(s): "GLUCAP" in the last 168 hours. Lipid Profile: No results for input(s): "CHOL", "HDL", "LDLCALC", "TRIG", "CHOLHDL", "LDLDIRECT" in the last 72 hours. Thyroid Function Tests: No results for input(s): "TSH",  "T4TOTAL", "FREET4", "T3FREE", "THYROIDAB" in the last 72 hours. Anemia Panel: No results for input(s): "VITAMINB12", "FOLATE", "FERRITIN", "TIBC", "IRON", "RETICCTPCT" in the last 72 hours. Sepsis Labs: No results for input(s): "PROCALCITON", "LATICACIDVEN" in the last 168 hours.  Recent Results (from the past 240 hour(s))  SARS Coronavirus 2 by RT PCR (hospital order, performed in [facility] hospital lab) *cepheid single result test* Anterior Nasal Swab     Status: None   Collection Time: 11/12/22 11:00 AM   Specimen: Anterior Nasal Swab  Result Value Ref Range Status   SARS Coronavirus 2 by RT PCR NEGATIVE NEGATIVE Final    Comment: (NOTE) SARS-CoV-2 target nucleic acids are NOT DETECTED.  The SARS-CoV-2 RNA is generally detectable in upper and lower respiratory specimens during the acute phase of infection. The lowest concentration of SARS-CoV-2 viral copies this assay can detect is 250 copies / mL. A negative result does not preclude SARS-CoV-2 infection and should not be used as the sole basis for treatment or other patient management decisions.  A negative result may occur with improper specimen collection / handling, submission of specimen other than nasopharyngeal swab, presence of viral mutation(s) within the areas targeted by this assay, and inadequate number of viral copies (<250 copies / mL). A negative result must be combined with clinical observations, patient history, and epidemiological information.  Fact Sheet for Patients:   RoadLapTop.co.za  Fact Sheet for Healthcare Providers: http://kim-miller.com/  This test is not yet approved or  cleared by the [place] FDA and has been authorized for detection and/or diagnosis of SARS-CoV-2 by FDA under an Emergency Use Authorization (EUA).  This EUA will remain in effect (meaning this test can be used) for the duration of the COVID-19 declaration under Section 564(b)(1)  of the Act, 21 U.S.C. section 360bbb-3(b)(1), unless the authorization is terminated or revoked sooner.  Performed at [facility], [address], [place], [place] [address]   Respiratory (~20 pathogens) panel by PCR     Status: Abnormal   Collection Time: 11/12/22 11:33 AM   Specimen: Nasopharyngeal Swab; Respiratory  Result Value Ref Range Status   Adenovirus NOT DETECTED NOT DETECTED Final   Coronavirus 229E NOT DETECTED NOT DETECTED Final    Comment: (NOTE) The Coronavirus on the Respiratory Panel, DOES NOT test for the novel  Coronavirus (2019 nCoV)    Coronavirus  HKU1 NOT DETECTED NOT DETECTED Final   Coronavirus NL63 NOT DETECTED NOT DETECTED Final   Coronavirus OC43 NOT DETECTED NOT DETECTED Final   Metapneumovirus NOT DETECTED NOT DETECTED Final   Rhinovirus / Enterovirus DETECTED (A) NOT DETECTED Final   Influenza A NOT DETECTED NOT DETECTED Final   Influenza B NOT DETECTED NOT DETECTED Final   Parainfluenza Virus 1 NOT DETECTED NOT DETECTED Final   Parainfluenza Virus 2 NOT DETECTED NOT DETECTED Final   Parainfluenza Virus 3 NOT DETECTED NOT DETECTED Final   Parainfluenza Virus 4 NOT DETECTED NOT DETECTED Final   Respiratory Syncytial Virus NOT DETECTED NOT DETECTED Final   Bordetella pertussis NOT DETECTED NOT DETECTED Final   Bordetella Parapertussis NOT DETECTED NOT DETECTED Final   Chlamydophila pneumoniae NOT DETECTED NOT DETECTED Final   Mycoplasma pneumoniae NOT DETECTED NOT DETECTED Final    Comment: Performed at [facility], [address], [place], [place] [address]         Radiology Studies: US RENAL  Result Date: 11/14/2022 CLINICAL DATA:  Acute kidney injury. EXAM: RENAL / URINARY TRACT ULTRASOUND COMPLETE COMPARISON:  CT abdomen and pelvis 11/09/2022, renal ultrasound 11/08/2022 FINDINGS: Right Kidney: Renal measurements: 7.1 x 3.7 x 3.3 cm = volume: 45 mL. Cortical atrophy is again seen. There is again mild fullness of the collecting system  without significant hydronephrosis. Left Kidney: Renal measurements: 11.1 x 5.7 x 5.4 cm = volume: 180 mL. Unchanged left lower pole 4.9 x 4.7 x 5.0 cm simple cyst. No follow-up imaging is recommended. Bladder: Appears normal for degree of bladder distention. No ureteral jet was identified. Other: None. IMPRESSION: 1. No significant change compared to 11/08/2022. 2. Unchanged chronic right renal cortical atrophy and mild fullness of the right renal collecting system without significant hydronephrosis. 3. Unchanged left lower pole simple cyst.  No left hydronephrosis. Electronically Signed   By: Neita Garnet M.D.   On: 11/14/2022 11:25        Scheduled Meds:  amLODipine  10 mg Oral Daily   heparin  5,000 Units Subcutaneous Q8H   hydrALAZINE  50 mg Oral TID   isosorbide mononitrate  30 mg Oral Daily   pantoprazole  40 mg Oral Daily   predniSONE  20 mg Oral Q breakfast   Ensure Max Protein  11 oz Oral Daily   sodium bicarbonate  650 mg Oral BID   sodium chloride flush  3 mL Intravenous Q12H   Continuous Infusions:  sodium chloride     lactated ringers 150 mL/hr at 11/15/22 0648     LOS: 2 days    Time spent: 35 minutes   Signed,  Marcelline Mates, MS4 Working with Dr. Maurilio Lovely

## 2022-11-16 LAB — BASIC METABOLIC PANEL
Anion gap: 8 (ref 5–15)
BUN: 43 mg/dL — ABNORMAL HIGH (ref 6–20)
CO2: 24 mmol/L (ref 22–32)
Calcium: 8.6 mg/dL — ABNORMAL LOW (ref 8.9–10.3)
Chloride: 105 mmol/L (ref 98–111)
Creatinine, Ser: 4.09 mg/dL — ABNORMAL HIGH (ref 0.61–1.24)
GFR, Estimated: 19 mL/min — ABNORMAL LOW (ref >60)
Glucose, Bld: 76 mg/dL (ref 70–99)
Potassium: 4.4 mmol/L (ref 3.5–5.1)
Sodium: 137 mmol/L (ref 135–145)

## 2022-11-16 LAB — CBC
HCT: 35.1 % — ABNORMAL LOW (ref 39.0–52.0)
Hemoglobin: 11.6 g/dL — ABNORMAL LOW (ref 13.0–17.0)
MCH: 29.1 pg (ref 26.0–34.0)
MCHC: 33 g/dL (ref 30.0–36.0)
MCV: 88.2 fL (ref 80.0–100.0)
Platelets: 323 10*3/uL (ref 150–400)
RBC: 3.98 MIL/uL — ABNORMAL LOW (ref 4.22–5.81)
RDW: 14 % (ref 11.5–15.5)
WBC: 10.3 10*3/uL (ref 4.0–10.5)
nRBC: 0 % (ref 0.0–0.2)

## 2022-11-16 LAB — HEMOGLOBIN A1C
Hgb A1c MFr Bld: 5.1 % (ref 4.8–5.6)
Mean Plasma Glucose: 100 mg/dL

## 2022-11-16 LAB — SEDIMENTATION RATE: Sed Rate: 6 mm/hr (ref 0–16)

## 2022-11-16 LAB — C-REACTIVE PROTEIN: CRP: <0.5 mg/dL (ref <1.0)

## 2022-11-16 LAB — MAGNESIUM: Magnesium: 2.2 mg/dL (ref 1.7–2.4)

## 2022-11-16 MED ORDER — AMLODIPINE BESYLATE 10 MG PO TABS: 10.0000 mg | ORAL_TABLET | Freq: Every day | ORAL | 3 refills | Status: DC

## 2022-11-16 MED ORDER — GUAIFENESIN 100 MG/5ML PO LIQD: 5.0000 mL | ORAL | 0 refills | Status: DC | PRN

## 2022-11-16 MED ORDER — PANTOPRAZOLE SODIUM 40 MG PO TBEC: 40.0000 mg | DELAYED_RELEASE_TABLET | Freq: Every day | ORAL | 1 refills | Status: DC

## 2022-11-16 MED ORDER — PREDNISONE 5 MG PO TABS: ORAL_TABLET | ORAL | 0 refills | Status: DC

## 2022-11-16 MED ORDER — ISOSORBIDE MONONITRATE ER 30 MG PO TB24: 30.0000 mg | ORAL_TABLET | Freq: Every day | ORAL | 3 refills | Status: DC

## 2022-11-16 MED ORDER — HYDRALAZINE HCL 50 MG PO TABS: 50.0000 mg | ORAL_TABLET | Freq: Three times a day (TID) | ORAL | 3 refills | Status: DC

## 2022-11-16 NOTE — Progress Notes (Signed)
Patient ID: Manuel Riggs, male   DOB: [birth year], 32 y.o.   MRN: [id number] [facility] Progress Note   Assessment/ Plan:   1. Acute kidney Injury on chronic kidney disease stage IV: With a solitary functioning left kidney status post right renal injury/atrophy from staghorn calculus and obstruction.  Current acute kidney injury was thought to be hemodynamically mediated in the setting of acute pancreatitis and is slowly improving with supportive management/intravenous fluids.  He is now able to tolerate oral intake and I recommend discontinuing intravenous fluids with potential consideration of discharge home later today.  He already has an outpatient nephrology follow-up at the [facility] on 7/15 (patient aware). 2.  Chest pain: Suspected pericarditis versus musculoskeletal chest pain with negative ESR/CRP.  Ongoing additional evaluation by cardiology and taper prednisone per their recommendations. 3.  Hypertension: Blood pressures currently acceptable with intermittent elevations associated with pain. 4.  Acute pancreatitis: Clinically improved, no evidence of gallstones.  Subjective:   Reports that he feels better this morning with decreased abdominal/chest pain and ability to tolerate oral intake without issues.  Still having intermittent coughing paroxysms.   Objective:   BP 120/78 (BP Location: Right Arm)   Pulse 74   Temp 97.9 F (36.6 C) (Oral)   Resp 20   Ht 5\' 6"  (1.676 m)   Wt 77 kg   SpO2 96%   BMI 27.40 kg/m   Intake/Output Summary (Last 24 hours) at 11/16/2022 0836 Last data filed at 11/15/2022 1834 Gross per 24 hour  Intake 960 ml  Output --  Net 960 ml   Weight change:   Physical Exam: Gen: Comfortably resting in bed, playing videogame CVS: Pulse regular rhythm, normal rate, S1 and S2 normal Resp: Clear to auscultation bilaterally without distinct rales or rhonchi Abd: Soft, obese, nontender, bowel sounds normal Ext:  No lower extremity edema  Imaging: US RENAL  Result Date: 11/14/2022 CLINICAL DATA:  Acute kidney injury. EXAM: RENAL / URINARY TRACT ULTRASOUND COMPLETE COMPARISON:  CT abdomen and pelvis 11/09/2022, renal ultrasound 11/08/2022 FINDINGS: Right Kidney: Renal measurements: 7.1 x 3.7 x 3.3 cm = volume: 45 mL. Cortical atrophy is again seen. There is again mild fullness of the collecting system without significant hydronephrosis. Left Kidney: Renal measurements: 11.1 x 5.7 x 5.4 cm = volume: 180 mL. Unchanged left lower pole 4.9 x 4.7 x 5.0 cm simple cyst. No follow-up imaging is recommended. Bladder: Appears normal for degree of bladder distention. No ureteral jet was identified. Other: None. IMPRESSION: 1. No significant change compared to 11/08/2022. 2. Unchanged chronic right renal cortical atrophy and mild fullness of the right renal collecting system without significant hydronephrosis. 3. Unchanged left lower pole simple cyst.  No left hydronephrosis. Electronically Signed   By: Neita Garnet M.D.   On: 11/14/2022 11:25    Labs: BMET Recent Labs  Lab 11/10/22 0422 11/11/22 0415 11/11/22 1705 11/12/22 0919 11/13/22 0537 11/14/22 0442 11/15/22 0420 11/16/22 0414  NA 138 140 137 141 138 136 136 137  K 4.3 4.0 3.6 3.9 3.9 4.3 4.7 4.4  CL 109 110 108 112* 107 107 104 105  CO2 19* 19* 20* 20* 21* 21* 23 24  GLUCOSE 84 94 160* 86 92 87 81 76  BUN 29* 27* 28* 25* 28* 45* 48* 43*  CREATININE 3.78* 3.61* 3.66* 3.71* 3.98* 4.60* 4.26* 4.09*  CALCIUM 8.3* 8.5* 8.6* 8.8* 8.7* 8.6* 8.6* 8.6*  PHOS 3.3 3.0  --   --   --   --   --   --  CBC Recent Labs  Lab 11/10/22 0701 11/11/22 1705 11/13/22 0537 11/14/22 0442 11/15/22 0420 11/16/22 0414  WBC 6.1   < > 6.7 9.1 9.3 10.3  NEUTROABS 4.3  --   --   --   --   --   HGB 12.7*   < > 13.0 12.8* 11.4* 11.6*  HCT 39.3   < > 39.5 39.5 34.7* 35.1*  MCV 90.1   < > 89.2 90.0 89.0 88.2  PLT 199   < > 243 266 285 323   < > = values in this interval  not displayed.    Medications:     amLODipine  10 mg Oral Daily   heparin  5,000 Units Subcutaneous Q8H   hydrALAZINE  50 mg Oral TID   isosorbide mononitrate  30 mg Oral Daily   pantoprazole  40 mg Oral Daily   predniSONE  20 mg Oral Q breakfast   Ensure Max Protein  11 oz Oral Daily   sodium bicarbonate  650 mg Oral BID   sodium chloride flush  3 mL Intravenous Q12H    Zetta Bills, MD 11/16/2022, 8:36 AM

## 2022-11-16 NOTE — Discharge Summary (Signed)
Physician Discharge Summary  [name]:[id number] DOB: [birth year] DOA: 11/11/2022  PCP: Patient, No Pcp Per  Admit date: 11/11/2022  Discharge date: 11/16/2022  Admitted From: Home  Disposition:  Home  Recommendations for Outpatient Follow-up:  Follow up with PCP in 1-2 weeks Follow up with Cardiology at their clinic on 7/10 Take 4 tablets (20mg  total) of Prednisone daily with breakfast for Pericarditis for the next 7 days (7/3-7/9). THEN take 3 tablets (15 mg total) daily with breakfast for 14 days, THEN 2 tablets (10 mg total) daily with breakfast for 14 days, THEN 1 tablet (5 mg total) daily with breakfast for 14 days. Your Cardiologist will confirm the steroid taper after seeing you in clinic on 7/10. Take Pantoprazole/Protonix daily while taking Prednisone Follow up with Nephrology on 7/15 at [facility] Please obtain BMP/CBC in one week Please follow up on the following pending results: None at this time. Continue taking your blood pressure medications: Amlodipine 10mg  daily, Hydralazine 50mg  three times a day, Imdur 30mg  daily. Encourage you to completely quit smoking cigarettes. Speak with your PCP about nicotine replacement and cessation strategies.  Home Health: None  Equipment/Devices: None  Discharge Condition:Stable  CODE STATUS: Full  Diet recommendation: Heart Healthy  Brief/Interim Summary:  Manuel Riggs is a 31 y.o. male with medical history significant for obstructive nephrolithiasis (s/p nephrostomy tubes 2012) complicated by CKD, HTN, and tobacco use disorder, who presented with chest pain and was admitted for suspected pericarditis with AKI, with his course complicated by suspected mild pancreatitis.   (Manuel Riggs was also admitted from 6/23-6/27 for AKI, iso likely acute viral illness, with Cr at 4.2, trended to 3.61 by discharge. Hospital stay significant for Renal U/S which revealed an atrophic right kidney. That hospital  course was complicated by hypertensive crisis and was discharged with Norvasc 10mg , Hydralazine 50mg  TID, Isosorbide 30mg .)   Pt presented to ED on 6/27 with CP and a witnessed syncopal episode. V/Q scan was negative for PE. Serial troponins peaked at 46 and EKG significant for sinus tachycardia and some T wave abnormalities. 6/28 Echo without pericardial effusion, LVEF 60-65%, and LV hypertrophy. Respiratory pathogen panel positive for Rhinovirus, and pt was clinically suspected to have viral Pericarditis. Due to Manuel. Arceneaux low GFR<20, he was treated with Prednisone 20mg  daily, rather than Colchicine or NSAIDs. Chest pain overall improved alongside treatment.  Admission was complicated by AKI on CKD with Cr up 3.71>4.60. Nephrology followed, was likely pre-renal in the setting of increased Lipase and concern for mild/developing pancreatitis with nausea and vomiting over the weekend. Responded well to aggressive IVF and NPO. Cr and Lipase improved by 7/1, alongside improvement in nausea and vomiting. Pt tolerated regular diet by day of discharge.  He will take a steroid taper starting with 20mg  Prednisone daily for seven days, and will follow up with Cardiology as outpatient on 7/10. The rest of the taper is described above and below, and can be adjusted by Cardiology on 7/10.  Discharge Diagnoses:  Principal Problem:   Pleuritic chest pain Active Problems:   Nonspecific abnormal electrocardiogram (ECG) (EKG)   Acute idiopathic pericarditis   Chest pain on breathing   Pericarditis    Discharge Instructions  Discharge Instructions     Diet - low sodium heart healthy   Complete by: As directed    Increase activity slowly   Complete by: As directed       Allergies as of 11/16/2022  Reactions   Aspirin    Break out        Medication List     STOP taking these medications    methocarbamol 750 MG tablet Commonly known as: ROBAXIN       TAKE these medications     acetaminophen 325 MG tablet Commonly known as: TYLENOL Take 2 tablets (650 mg total) by mouth every 6 (six) hours as needed for mild pain (or Fever >/= 101).   amLODipine 10 MG tablet Commonly known as: NORVASC Take 1 tablet (10 mg total) by mouth daily.   guaiFENesin 100 MG/5ML liquid Commonly known as: ROBITUSSIN Take 5 mLs by mouth every 4 (four) hours as needed for cough or to loosen phlegm.   hydrALAZINE 50 MG tablet Commonly known as: APRESOLINE Take 1 tablet (50 mg total) by mouth 3 (three) times daily.   isosorbide mononitrate 30 MG 24 hr tablet Commonly known as: IMDUR Take 1 tablet (30 mg total) by mouth daily.   ondansetron 4 MG tablet Commonly known as: Zofran Take 1 tablet (4 mg total) by mouth every 8 (eight) hours as needed for nausea or vomiting.   pantoprazole 40 MG tablet Commonly known as: PROTONIX Take 1 tablet (40 mg total) by mouth daily. Start taking on: November 17, 2022   predniSONE 5 MG tablet Commonly known as: DELTASONE Take 4 tablets (20 mg total) by mouth daily with breakfast for 7 days, THEN 3 tablets (15 mg total) daily with breakfast for 14 days, THEN 2 tablets (10 mg total) daily with breakfast for 14 days, THEN 1 tablet (5 mg total) daily with breakfast for 14 days. Start taking on: November 17, 2022   sodium bicarbonate 650 MG tablet Take 1 tablet (650 mg total) by mouth 2 (two) times daily.        Follow-up Information     Mallipeddi, Orion Modest, MD Follow up on 11/24/2022.   Specialties: Cardiology, Internal Medicine Why: Cardiology Hospital Follow-up on 11/24/2022 at 10:40 AM at the [facility] OFFICE. Contact information: [address] [place] [address] [phone]                Allergies  Allergen Reactions   Aspirin     Break out    Consultations: Cardiology Nephrology   Procedures/Studies: US RENAL  Result Date: 11/14/2022 CLINICAL DATA:  Acute kidney injury. EXAM: RENAL / URINARY TRACT ULTRASOUND COMPLETE  COMPARISON:  CT abdomen and pelvis 11/09/2022, renal ultrasound 11/08/2022 FINDINGS: Right Kidney: Renal measurements: 7.1 x 3.7 x 3.3 cm = volume: 45 mL. Cortical atrophy is again seen. There is again mild fullness of the collecting system without significant hydronephrosis. Left Kidney: Renal measurements: 11.1 x 5.7 x 5.4 cm = volume: 180 mL. Unchanged left lower pole 4.9 x 4.7 x 5.0 cm simple cyst. No follow-up imaging is recommended. Bladder: Appears normal for degree of bladder distention. No ureteral jet was identified. Other: None. IMPRESSION: 1. No significant change compared to 11/08/2022. 2. Unchanged chronic right renal cortical atrophy and mild fullness of the right renal collecting system without significant hydronephrosis. 3. Unchanged left lower pole simple cyst.  No left hydronephrosis. Electronically Signed   By: Neita Garnet M.D.   On: 11/14/2022 11:25   ECHOCARDIOGRAM COMPLETE  Result Date: 11/12/2022    ECHOCARDIOGRAM REPORT   Patient Name:   MATTSON SOUTHERN Date of Exam: 11/12/2022 Medical Rec #:  [id number]     Height:       66.0 in Accession #:  [id number]    Weight:       169.8 lb Date of Birth:  [birth year]     BSA:          1.865 m Patient Age:    32 years      BP:           168/113 mmHg Patient Gender: M             HR:           91 bpm. [facility]am [facility] Procedure: 2D Echo, Cardiac Doppler and Color Doppler Indications:    Chest Pain R07.9  History:        Patient has no prior history of Echocardiogram examinations.                 Risk Factors:Tobacco use disorder. Pleuritic chest pain.  Sonographer:    Celesta Gentile RCS Referring Phys: [id number] PRATIK D [facility] IMPRESSIONS  1. Left ventricular ejection fraction, by estimation, is 60 to 65%. The left ventricle has normal function. The left ventricle has no regional wall motion abnormalities. There is mild left ventricular hypertrophy. Left ventricular diastolic parameters were normal.  2. Right ventricular systolic function is  normal. The right ventricular size is normal.  3. The mitral valve is normal in structure. No evidence of mitral valve regurgitation. No evidence of mitral stenosis.  4. The aortic valve is tricuspid. Aortic valve regurgitation is not visualized. No aortic stenosis is present.  5. The inferior vena cava is normal in size with greater than 50% respiratory variability, suggesting right atrial pressure of 3 mmHg. FINDINGS  Left Ventricle: Left ventricular ejection fraction, by estimation, is 60 to 65%. The left ventricle has normal function. The left ventricle has no regional wall motion abnormalities. The left ventricular internal cavity size was normal in size. There is  mild left ventricular hypertrophy. Left ventricular diastolic parameters were normal. Right Ventricle: The right ventricular size is normal. No increase in right ventricular wall thickness. Right ventricular systolic function is normal. Left Atrium: Left atrial size was normal in size. Right Atrium: Right atrial size was normal in size. Pericardium: There is no evidence of pericardial effusion. Mitral Valve: The mitral valve is normal in structure. No evidence of mitral valve regurgitation. No evidence of mitral valve stenosis. Tricuspid Valve: The tricuspid valve is normal in structure. Tricuspid valve regurgitation is not demonstrated. No evidence of tricuspid stenosis. Aortic Valve: The aortic valve is tricuspid. Aortic valve regurgitation is not visualized. No aortic stenosis is present. Pulmonic Valve: The pulmonic valve was normal in structure. Pulmonic valve regurgitation is not visualized. No evidence of pulmonic stenosis. Aorta: The aortic root is normal in size and structure. Venous: The inferior vena cava is normal in size with greater than 50% respiratory variability, suggesting right atrial pressure of 3 mmHg. IAS/Shunts: No atrial level shunt detected by color flow Doppler.  LEFT VENTRICLE PLAX 2D LVIDd:         4.00 cm   Diastology  LVIDs:         2.20 cm   LV e' medial:    6.20 cm/s LV PW:         1.20 cm   LV E/e' medial:  16.6 LV IVS:        1.30 cm   LV e' lateral:   10.80 cm/s LVOT diam:     2.00 cm   LV E/e' lateral: 9.5 LV SV:  73 LV SV Index:   39 LVOT Area:     3.14 cm  RIGHT VENTRICLE RV S prime:     16.40 cm/s TAPSE (M-mode): 2.2 cm LEFT ATRIUM           Index        RIGHT ATRIUM           Index LA diam:      3.20 cm 1.72 cm/m   RA Area:     13.20 cm LA Vol (A2C): 22.4 ml 12.01 ml/m  RA Volume:   27.50 ml  14.74 ml/m LA Vol (A4C): 22.5 ml 12.06 ml/m  AORTIC VALVE LVOT Vmax:   140.00 cm/s LVOT Vmean:  90.500 cm/s LVOT VTI:    0.231 m  AORTA Ao Root diam: 3.20 cm MITRAL VALVE MV Area (PHT): 3.12 cm     SHUNTS MV Decel Time: 243 msec     Systemic VTI:  0.23 m MV E velocity: 103.00 cm/s  Systemic Diam: 2.00 cm MV A velocity: 74.80 cm/s MV E/A ratio:  1.38 Charlton Haws MD Electronically signed by Charlton Haws MD Signature Date/Time: 11/12/2022/4:10:32 PM    Final    NM Pulmonary Perfusion  Result Date: 11/11/2022 CLINICAL DATA:  Short of breath, chest pain EXAM: NUCLEAR MEDICINE PERFUSION LUNG SCAN TECHNIQUE: Perfusion images were obtained in multiple projections after intravenous injection of radiopharmaceutical. Ventilation scans intentionally deferred if perfusion scan and chest x-ray adequate for interpretation during COVID 19 epidemic. RADIOPHARMACEUTICALS:  4.4 mCi Tc-1m MAA IV COMPARISON:  11/11/2022 FINDINGS: Planar images of the chest are obtained in multiple projections during the perfusion exam. There are no perfusion defects. Normal radiotracer distribution. IMPRESSION: 1. No evidence of pulmonary embolus.  No perfusion defects. Electronically Signed   By: Sharlet Salina M.D.   On: 11/11/2022 21:01   DG Chest Port 1 View  Result Date: 11/11/2022 CLINICAL DATA:  Chest pain. EXAM: PORTABLE CHEST 1 VIEW COMPARISON:  November 07, 2022. FINDINGS: The heart size and mediastinal contours are within normal limits.  Both lungs are clear. The visualized skeletal structures are unremarkable. IMPRESSION: No active disease. Electronically Signed   By: Lupita Raider M.D.   On: 11/11/2022 18:32   CT ABDOMEN PELVIS WO CONTRAST  Result Date: 11/10/2022 CLINICAL DATA:  Intermittent right flank pain for several months. History of kidney stones. Acute kidney failure, hypertension. EXAM: CT ABDOMEN AND PELVIS WITHOUT CONTRAST TECHNIQUE: Multidetector CT imaging of the abdomen and pelvis was performed following the standard protocol without IV contrast. RADIATION DOSE REDUCTION: This exam was performed according to the departmental dose-optimization program which includes automated exposure control, adjustment of the mA and/or kV according to patient size and/or use of iterative reconstruction technique. COMPARISON:  Abdominopelvic CT 09/16/2019. Renal ultrasound 11/08/2022. FINDINGS: Lower chest: Minimal subpleural linear atelectasis or scarring at both lung bases. The lung bases are otherwise clear. No significant pleural or pericardial effusion. Hepatobiliary: The liver appears unremarkable as imaged in the noncontrast state. No evidence of gallstones, gallbladder wall thickening or biliary dilatation. Pancreas: Unremarkable. No pancreatic ductal dilatation or surrounding inflammatory changes. Spleen: Normal in size without focal abnormality. Adrenals/Urinary Tract: Both adrenal glands appear normal. No evidence of urinary tract calculus, suspicious renal lesion or hydronephrosis. A simple appearing cyst in the interpolar region of the left kidney has mildly enlarged in the interval, measuring 5.3 cm on image 32/2. There is an additional cyst in the upper pole the left kidney. These renal lesions were seen on ultrasound and  require no additional follow-up imaging. As seen on the ultrasound, there is progressive atrophy and cortical thinning of the right kidney. The bladder appears unremarkable for its degree of distention.  Stomach/Bowel: No enteric contrast administered. The stomach appears unremarkable for its degree of distension. No evidence of bowel wall thickening, distention or surrounding inflammatory change. Interval appendectomy. Vascular/Lymphatic: There are no enlarged abdominal or pelvic lymph nodes. Minimal iliac atherosclerosis. No evidence of aneurysm. Reproductive: The prostate gland and seminal vesicles appear unchanged. Other: No evidence of abdominal wall mass or hernia. No ascites or pneumoperitoneum. Subcutaneous edema within the anterior abdominal wall attributed to subcutaneous injections. Musculoskeletal: No acute or significant osseous findings. IMPRESSION: 1. No acute findings or explanation for the patient's symptoms. No evidence of urinary tract calculus or hydronephrosis. 2. Progressive atrophy and cortical thinning of the right kidney. 3. Interval appendectomy. Electronically Signed   By: Carey Bullocks M.D.   On: 11/10/2022 13:15   US Abdomen Limited RUQ (LIVER/GB)  Result Date: 11/09/2022 CLINICAL DATA:  Right upper quadrant pain and nausea for 3 days EXAM: ULTRASOUND ABDOMEN LIMITED RIGHT UPPER QUADRANT COMPARISON:  Renal ultrasound 11/08/2022.  CT scan 09/16/2019 FINDINGS: Gallbladder: No gallstones or wall thickening visualized. No sonographic Murphy sign noted by sonographer. Common bile duct: Diameter: 3 mm Liver: No focal lesion identified. Within normal limits in parenchymal echogenicity. Portal vein is patent on color Doppler imaging with normal direction of blood flow towards the liver. Other: None. IMPRESSION: No gallstones or ductal dilatation. Electronically Signed   By: Karen Kays M.D.   On: 11/09/2022 13:57   US RENAL  Result Date: 11/08/2022 CLINICAL DATA:  Acute kidney injury. History of bilateral renal calculi and previous bilateral hydronephrosis. Status post previous bilateral percutaneous nephrolithotomy in 2012 at [facility]. EXAM: RENAL / URINARY TRACT ULTRASOUND  COMPLETE COMPARISON:  CT of the abdomen and pelvis without contrast on 09/21/2010 FINDINGS: Right Kidney: Renal measurements: 8.5 x 5.4 x 3.9 cm = volume: 93 mL. Cortical atrophy. Mild fullness of the collecting system without significant hydronephrosis. Left Kidney: Renal measurements: 12.5 x 6.5 x 5.6 cm = volume: 237 mL. No hydronephrosis. Simple cyst of the mid left renal cortex measures up to 5 cm. This is consistent with a Bosniak 1 cyst and requires no follow-up. Bladder: Appears normal for degree of bladder distention. Other: None. IMPRESSION: 1. Atrophic right kidney with mild fullness of the collecting system but no significant hydronephrosis. 2. 5 cm simple cyst of the mid left renal cortex. No evidence of left-sided hydronephrosis. Electronically Signed   By: Irish Lack M.D.   On: 11/08/2022 10:49   DG Chest Portable 1 View  Result Date: 11/07/2022 CLINICAL DATA:  Cough EXAM: PORTABLE CHEST 1 VIEW COMPARISON:  Radiographs 09/26/2005 FINDINGS: Stable cardiomediastinal silhouette. No focal consolidation, pleural effusion, or pneumothorax. No displaced rib fractures. IMPRESSION: No active disease. Electronically Signed   By: Minerva Fester M.D.   On: 11/07/2022 23:42     Discharge Exam: Vitals:   11/16/22 0032 11/16/22 0420  BP: 120/81 120/78  Pulse: 80 74  Resp: 20 20  Temp: 98.6 F (37 C) 97.9 F (36.6 C)  SpO2: 96% 96%   Vitals:   11/15/22 2109 11/15/22 2111 11/16/22 0032 11/16/22 0420  BP: (!) 147/73 (!) 147/73 120/81 120/78  Pulse:  93 80 74  Resp:  18 20 20   Temp:  98.3 F (36.8 C) 98.6 F (37 C) 97.9 F (36.6 C)  TempSrc:  Oral Oral Oral  SpO2:  99% 96% 96%  Weight:      Height:        General: Pt is alert, awake, not in acute distress  Cardiovascular: RRR, S1/S2 +, no rubs, no gallops Respiratory: CTA bilaterally, no wheezing, no rhonchi Abdominal: Soft, NT, ND, bowel sounds + Extremities: no edema, no cyanosis    The results of significant diagnostics  from this hospitalization (including imaging, microbiology, ancillary and laboratory) are listed below for reference.     Microbiology: Recent Results (from the past 240 hour(s))  SARS Coronavirus 2 by RT PCR (hospital order, performed in [facility] hospital lab) *cepheid single result test* Anterior Nasal Swab     Status: None   Collection Time: 11/12/22 11:00 AM   Specimen: Anterior Nasal Swab  Result Value Ref Range Status   SARS Coronavirus 2 by RT PCR NEGATIVE NEGATIVE Final    Comment: (NOTE) SARS-CoV-2 target nucleic acids are NOT DETECTED.  The SARS-CoV-2 RNA is generally detectable in upper and lower respiratory specimens during the acute phase of infection. The lowest concentration of SARS-CoV-2 viral copies this assay can detect is 250 copies / mL. A negative result does not preclude SARS-CoV-2 infection and should not be used as the sole basis for treatment or other patient management decisions.  A negative result may occur with improper specimen collection / handling, submission of specimen other than nasopharyngeal swab, presence of viral mutation(s) within the areas targeted by this assay, and inadequate number of viral copies (<250 copies / mL). A negative result must be combined with clinical observations, patient history, and epidemiological information.  Fact Sheet for Patients:   RoadLapTop.co.za  Fact Sheet for Healthcare Providers: http://kim-miller.com/  This test is not yet approved or  cleared by the [place] FDA and has been authorized for detection and/or diagnosis of SARS-CoV-2 by FDA under an Emergency Use Authorization (EUA).  This EUA will remain in effect (meaning this test can be used) for the duration of the COVID-19 declaration under Section 564(b)(1) of the Act, 21 U.S.C. section 360bbb-3(b)(1), unless the authorization is terminated or revoked sooner.  Performed at [facility], [address] [place], [place] [address]   Respiratory (~20 pathogens) panel by PCR     Status: Abnormal   Collection Time: 11/12/22 11:33 AM   Specimen: Nasopharyngeal Swab; Respiratory  Result Value Ref Range Status   Adenovirus NOT DETECTED NOT DETECTED Final   Coronavirus 229E NOT DETECTED NOT DETECTED Final    Comment: (NOTE) The Coronavirus on the Respiratory Panel, DOES NOT test for the novel  Coronavirus (2019 nCoV)    Coronavirus HKU1 NOT DETECTED NOT DETECTED Final   Coronavirus NL63 NOT DETECTED NOT DETECTED Final   Coronavirus OC43 NOT DETECTED NOT DETECTED Final   Metapneumovirus NOT DETECTED NOT DETECTED Final   Rhinovirus / Enterovirus DETECTED (A) NOT DETECTED Final   Influenza A NOT DETECTED NOT DETECTED Final   Influenza B NOT DETECTED NOT DETECTED Final   Parainfluenza Virus 1 NOT DETECTED NOT DETECTED Final   Parainfluenza Virus 2 NOT DETECTED NOT DETECTED Final   Parainfluenza Virus 3 NOT DETECTED NOT DETECTED Final   Parainfluenza Virus 4 NOT DETECTED NOT DETECTED Final   Respiratory Syncytial Virus NOT DETECTED NOT DETECTED Final   Bordetella pertussis NOT DETECTED NOT DETECTED Final   Bordetella Parapertussis NOT DETECTED NOT DETECTED Final   Chlamydophila pneumoniae NOT DETECTED NOT DETECTED Final   Mycoplasma pneumoniae NOT DETECTED NOT DETECTED Final    Comment: Performed at [facility], [address], [place], [place] [address]     Labs: BNP (last 3 results) Recent Labs    11/11/22 1705  BNP 68.0   Basic Metabolic Panel: Recent Labs  Lab 11/10/22 0422 11/11/22 0415 11/11/22 1705 11/12/22 0919 11/13/22 0537 11/14/22 0442 11/15/22 0420 11/16/22 0414  NA 138 140   < > 141 138 136 136 137  K 4.3 4.0   < > 3.9 3.9 4.3 4.7 4.4  CL 109 110   < > 112* 107 107 104 105  CO2 19* 19*   < > 20* 21* 21* 23 24  GLUCOSE 84 94   < > 86 92 87 81 76  BUN 29* 27*   < > 25* 28* 45* 48* 43*  CREATININE 3.78* 3.61*   < > 3.71* 3.98* 4.60* 4.26* 4.09*   CALCIUM 8.3* 8.5*   < > 8.8* 8.7* 8.6* 8.6* 8.6*  MG  --   --   --   --  2.1 2.4 2.2 2.2  PHOS 3.3 3.0  --   --   --   --   --   --    < > = values in this interval not displayed.   Liver Function Tests: Recent Labs  Lab 11/10/22 0422 11/11/22 0415 11/12/22 0919 11/14/22 0900 11/15/22 0420  AST  --   --  32 25 22  ALT  --   --  34 42 40  ALKPHOS  --   --  87 81 74  BILITOT  --   --  0.6 0.3 0.4  PROT  --   --  7.2 6.6 5.9*  ALBUMIN 3.2* 3.2* 3.5 3.3* 3.0*   Recent Labs  Lab 11/14/22 0900 11/15/22 0420  LIPASE 75* 31   No results for input(s): "AMMONIA" in the last 168 hours. CBC: Recent Labs  Lab 11/10/22 0701 11/11/22 1705 11/12/22 0919 11/13/22 0537 11/14/22 0442 11/15/22 0420 11/16/22 0414  WBC 6.1   < > 5.5 6.7 9.1 9.3 10.3  NEUTROABS 4.3  --   --   --   --   --   --   HGB 12.7*   < > 13.8 13.0 12.8* 11.4* 11.6*  HCT 39.3   < > 42.8 39.5 39.5 34.7* 35.1*  MCV 90.1   < > 90.1 89.2 90.0 89.0 88.2  PLT 199   < > 211 243 266 285 323   < > = values in this interval not displayed.   Cardiac Enzymes: No results for input(s): "CKTOTAL", "CKMB", "CKMBINDEX", "TROPONINI" in the last 168 hours. BNP: Invalid input(s): "POCBNP" CBG: No results for input(s): "GLUCAP" in the last 168 hours. D-Dimer No results for input(s): "DDIMER" in the last 72 hours. Hgb A1c Recent Labs    11/15/22 0420  HGBA1C 5.1   Lipid Profile Recent Labs    11/15/22 0420  CHOL 205*  HDL 51  LDLCALC 135*  TRIG 97  CHOLHDL 4.0   Thyroid function studies No results for input(s): "TSH", "T4TOTAL", "T3FREE", "THYROIDAB" in the last 72 hours.  Invalid input(s): "FREET3" Anemia work up No results for input(s): "VITAMINB12", "FOLATE", "FERRITIN", "TIBC", "IRON", "RETICCTPCT" in the last 72 hours. Urinalysis    Component Value Date/Time   COLORURINE STRAW (A) 11/14/2022 1524   APPEARANCEUR CLEAR 11/14/2022 1524   LABSPEC 1.009 11/14/2022 1524   PHURINE 6.0 11/14/2022 1524    GLUCOSEU NEGATIVE 11/14/2022 1524   HGBUR NEGATIVE 11/14/2022 1524   BILIRUBINUR NEGATIVE 11/14/2022 1524  KETONESUR NEGATIVE 11/14/2022 1524   PROTEINUR 100 (A) 11/14/2022 1524   UROBILINOGEN 0.2 09/16/2010 2015   NITRITE NEGATIVE 11/14/2022 1524   LEUKOCYTESUR NEGATIVE 11/14/2022 1524   Sepsis Labs Recent Labs  Lab 11/13/22 0537 11/14/22 0442 11/15/22 0420 11/16/22 0414  WBC 6.7 9.1 9.3 10.3   Microbiology Recent Results (from the past 240 hour(s))  SARS Coronavirus 2 by RT PCR (hospital order, performed in [facility] hospital lab) *cepheid single result test* Anterior Nasal Swab     Status: None   Collection Time: 11/12/22 11:00 AM   Specimen: Anterior Nasal Swab  Result Value Ref Range Status   SARS Coronavirus 2 by RT PCR NEGATIVE NEGATIVE Final    Comment: (NOTE) SARS-CoV-2 target nucleic acids are NOT DETECTED.  The SARS-CoV-2 RNA is generally detectable in upper and lower respiratory specimens during the acute phase of infection. The lowest concentration of SARS-CoV-2 viral copies this assay can detect is 250 copies / mL. A negative result does not preclude SARS-CoV-2 infection and should not be used as the sole basis for treatment or other patient management decisions.  A negative result may occur with improper specimen collection / handling, submission of specimen other than nasopharyngeal swab, presence of viral mutation(s) within the areas targeted by this assay, and inadequate number of viral copies (<250 copies / mL). A negative result must be combined with clinical observations, patient history, and epidemiological information.  Fact Sheet for Patients:   RoadLapTop.co.za  Fact Sheet for Healthcare Providers: http://kim-miller.com/  This test is not yet approved or  cleared by the [place] FDA and has been authorized for detection and/or diagnosis of SARS-CoV-2 by FDA under an Emergency Use Authorization  (EUA).  This EUA will remain in effect (meaning this test can be used) for the duration of the COVID-19 declaration under Section 564(b)(1) of the Act, 21 U.S.C. section 360bbb-3(b)(1), unless the authorization is terminated or revoked sooner.  Performed at [facility], [address], [place], [place] [address]   Respiratory (~20 pathogens) panel by PCR     Status: Abnormal   Collection Time: 11/12/22 11:33 AM   Specimen: Nasopharyngeal Swab; Respiratory  Result Value Ref Range Status   Adenovirus NOT DETECTED NOT DETECTED Final   Coronavirus 229E NOT DETECTED NOT DETECTED Final    Comment: (NOTE) The Coronavirus on the Respiratory Panel, DOES NOT test for the novel  Coronavirus (2019 nCoV)    Coronavirus HKU1 NOT DETECTED NOT DETECTED Final   Coronavirus NL63 NOT DETECTED NOT DETECTED Final   Coronavirus OC43 NOT DETECTED NOT DETECTED Final   Metapneumovirus NOT DETECTED NOT DETECTED Final   Rhinovirus / Enterovirus DETECTED (A) NOT DETECTED Final   Influenza A NOT DETECTED NOT DETECTED Final   Influenza B NOT DETECTED NOT DETECTED Final   Parainfluenza Virus 1 NOT DETECTED NOT DETECTED Final   Parainfluenza Virus 2 NOT DETECTED NOT DETECTED Final   Parainfluenza Virus 3 NOT DETECTED NOT DETECTED Final   Parainfluenza Virus 4 NOT DETECTED NOT DETECTED Final   Respiratory Syncytial Virus NOT DETECTED NOT DETECTED Final   Bordetella pertussis NOT DETECTED NOT DETECTED Final   Bordetella Parapertussis NOT DETECTED NOT DETECTED Final   Chlamydophila pneumoniae NOT DETECTED NOT DETECTED Final   Mycoplasma pneumoniae NOT DETECTED NOT DETECTED Final    Comment: Performed at [facility], [address] [place], [place] [address]     Time coordinating discharge: 35 minutes  SIGNED:  Marcelline Mates, MS4 Working with Dr. Ocie Doyne  Sherryll Burger

## 2022-11-16 NOTE — Progress Notes (Signed)
Nsg Discharge Note  Admit Date:  11/11/2022 Discharge date: 11/16/2022   Jeri Lager to be D/C'd Home per MD order.  AVS completed.   Patient/caregiver able to verbalize understanding.  Discharge Medication: Allergies as of 11/16/2022       Reactions   Aspirin    Break out        Medication List     STOP taking these medications    methocarbamol 750 MG tablet Commonly known as: ROBAXIN       TAKE these medications    acetaminophen 325 MG tablet Commonly known as: TYLENOL Take 2 tablets (650 mg total) by mouth every 6 (six) hours as needed for mild pain (or Fever >/= 101).   amLODipine 10 MG tablet Commonly known as: NORVASC Take 1 tablet (10 mg total) by mouth daily.   guaiFENesin 100 MG/5ML liquid Commonly known as: ROBITUSSIN Take 5 mLs by mouth every 4 (four) hours as needed for cough or to loosen phlegm.   hydrALAZINE 50 MG tablet Commonly known as: APRESOLINE Take 1 tablet (50 mg total) by mouth 3 (three) times daily.   isosorbide mononitrate 30 MG 24 hr tablet Commonly known as: IMDUR Take 1 tablet (30 mg total) by mouth daily.   ondansetron 4 MG tablet Commonly known as: Zofran Take 1 tablet (4 mg total) by mouth every 8 (eight) hours as needed for nausea or vomiting.   pantoprazole 40 MG tablet Commonly known as: PROTONIX Take 1 tablet (40 mg total) by mouth daily. Start taking on: November 17, 2022   predniSONE 5 MG tablet Commonly known as: DELTASONE Take 4 tablets (20 mg total) by mouth daily with breakfast for 7 days, THEN 3 tablets (15 mg total) daily with breakfast for 14 days, THEN 2 tablets (10 mg total) daily with breakfast for 14 days, THEN 1 tablet (5 mg total) daily with breakfast for 14 days. Start taking on: November 17, 2022   sodium bicarbonate 650 MG tablet Take 1 tablet (650 mg total) by mouth 2 (two) times daily.        Discharge Assessment: Vitals:   11/16/22 0032 11/16/22 0420  BP: 120/81 120/78  Pulse: 80 74  Resp: 20 20   Temp: 98.6 F (37 C) 97.9 F (36.6 C)  SpO2: 96% 96%   Skin clean, dry and intact without evidence of skin break down, no evidence of skin tears noted. IV catheter discontinued intact. Site without signs and symptoms of complications - no redness or edema noted at insertion site, patient denies c/o pain - only slight tenderness at site.  Dressing with slight pressure applied.  D/c Instructions-Education: Discharge instructions given to patient/family with verbalized understanding. D/c education completed with patient/family including follow up instructions, medication list, d/c activities limitations if indicated, with other d/c instructions as indicated by MD - patient able to verbalize understanding, all questions fully answered. Patient instructed to return to ED, call 911, or call MD for any changes in condition.  Patient escorted via WC, and D/C home via private auto.  Laurena Spies, RN 11/16/2022 10:24 AM

## 2022-11-16 NOTE — Progress Notes (Signed)
Rounding Note    Patient Name: Manuel Riggs Date of Encounter: 11/16/2022  [facility] Cardiologist: Luanna Cole  Subjective   Chest pain has resolved  Inpatient Medications    Scheduled Meds:  amLODipine  10 mg Oral Daily   heparin  5,000 Units Subcutaneous Q8H   hydrALAZINE  50 mg Oral TID   isosorbide mononitrate  30 mg Oral Daily   pantoprazole  40 mg Oral Daily   predniSONE  20 mg Oral Q breakfast   Ensure Max Protein  11 oz Oral Daily   sodium bicarbonate  650 mg Oral BID   sodium chloride flush  3 mL Intravenous Q12H   Continuous Infusions:  sodium chloride     PRN Meds: sodium chloride, acetaminophen **OR** acetaminophen, ALPRAZolam, guaiFENesin, HYDROmorphone (DILAUDID) injection, ondansetron, sodium chloride flush   Vital Signs    Vitals:   11/15/22 2109 11/15/22 2111 11/16/22 0032 11/16/22 0420  BP: (!) 147/73 (!) 147/73 120/81 120/78  Pulse:  93 80 74  Resp:  18 20 20   Temp:  98.3 F (36.8 C) 98.6 F (37 C) 97.9 F (36.6 C)  TempSrc:  Oral Oral Oral  SpO2:  99% 96% 96%  Weight:      Height:        Intake/Output Summary (Last 24 hours) at 11/16/2022 0836 Last data filed at 11/15/2022 1834 Gross per 24 hour  Intake 960 ml  Output --  Net 960 ml      11/12/2022   12:00 PM 11/07/2022   11:25 PM 11/07/2022   11:03 PM  Last 3 Weights  Weight (lbs) 169 lb 12.1 oz 164 lb 6.4 oz 189 lb  Weight (kg) 77 kg 74.571 kg 85.73 kg      Telemetry    SR - Personally Reviewed  ECG    N/a - Personally Reviewed  Physical Exam   GEN: No acute distress.   Neck: No JVD Cardiac: RRR, no murmurs, rubs, or gallops.  Respiratory: Clear to auscultation bilaterally. GI: Soft, nontender, non-distended  MS: No edema; No deformity. Neuro:  Nonfocal  Psych: Normal affect   Labs    High Sensitivity Troponin:   Recent Labs  Lab 11/07/22 2355 11/11/22 1705 11/11/22 1955 11/12/22 0742 11/12/22 0922  TROPONINIHS 15 29* 46* 23* 20*      Chemistry Recent Labs  Lab 11/12/22 0919 11/13/22 0537 11/14/22 0442 11/14/22 0900 11/15/22 0420 11/16/22 0414  NA 141   < > 136  --  136 137  K 3.9   < > 4.3  --  4.7 4.4  CL 112*   < > 107  --  104 105  CO2 20*   < > 21*  --  23 24  GLUCOSE 86   < > 87  --  81 76  BUN 25*   < > 45*  --  48* 43*  CREATININE 3.71*   < > 4.60*  --  4.26* 4.09*  CALCIUM 8.8*   < > 8.6*  --  8.6* 8.6*  MG  --    < > 2.4  --  2.2 2.2  PROT 7.2  --   --  6.6 5.9*  --   ALBUMIN 3.5  --   --  3.3* 3.0*  --   AST 32  --   --  25 22  --   ALT 34  --   --  42 40  --   ALKPHOS 87  --   --  81 74  --   BILITOT 0.6  --   --  0.3 0.4  --   GFRNONAA 21*   < > 16*  --  18* 19*  ANIONGAP 9   < > 8  --  9 8   < > = values in this interval not displayed.    Lipids  Recent Labs  Lab 11/15/22 0420  CHOL 205*  TRIG 97  HDL 51  LDLCALC 135*  CHOLHDL 4.0    Hematology Recent Labs  Lab 11/14/22 0442 11/15/22 0420 11/16/22 0414  WBC 9.1 9.3 10.3  RBC 4.39 3.90* 3.98*  HGB 12.8* 11.4* 11.6*  HCT 39.5 34.7* 35.1*  MCV 90.0 89.0 88.2  MCH 29.2 29.2 29.1  MCHC 32.4 32.9 33.0  RDW 13.5 13.8 14.0  PLT 266 285 323   Thyroid No results for input(s): "TSH", "FREET4" in the last 168 hours.  BNP Recent Labs  Lab 11/11/22 1705  BNP 68.0    DDimer No results for input(s): "DDIMER" in the last 168 hours.   Radiology    US RENAL  Result Date: 11/14/2022 CLINICAL DATA:  Acute kidney injury. EXAM: RENAL / URINARY TRACT ULTRASOUND COMPLETE COMPARISON:  CT abdomen and pelvis 11/09/2022, renal ultrasound 11/08/2022 FINDINGS: Right Kidney: Renal measurements: 7.1 x 3.7 x 3.3 cm = volume: 45 mL. Cortical atrophy is again seen. There is again mild fullness of the collecting system without significant hydronephrosis. Left Kidney: Renal measurements: 11.1 x 5.7 x 5.4 cm = volume: 180 mL. Unchanged left lower pole 4.9 x 4.7 x 5.0 cm simple cyst. No follow-up imaging is recommended. Bladder: Appears normal for degree  of bladder distention. No ureteral jet was identified. Other: None. IMPRESSION: 1. No significant change compared to 11/08/2022. 2. Unchanged chronic right renal cortical atrophy and mild fullness of the right renal collecting system without significant hydronephrosis. 3. Unchanged left lower pole simple cyst.  No left hydronephrosis. Electronically Signed   By: Neita Garnet M.D.   On: 11/14/2022 11:25    Cardiac Studies    Patient Profile     32 y.o. male with a history of CKD stage 3, prior staghorn calculus requiring nephrostomy tubes in 2012, HTN who is being seen for chest pain.   Assessment & Plan    1.Atypical chest pain/suspected pericarditis -Troponin values are mildly elevated, peaking at 46 and EKG does show diffuse T wave inversion along the precordial leads  - echo: LVEF 60-65%, no WMAs, no effusion.  ESR initially peak 25, CRP 0.5 - CXR no acute process - VQ scan no PE   - suspected pericarditis. Does report a viral illness about 1 week ago, PCR respiratory panel was positive for rhinovirus.  - Avoid NSAIDs with poor renal function.  - Started on colchcine but stopped due to very low GFR - started on prednisone 20mg  daily. If asymptomatic and normal inflammatory markers at 2 weeks would slowly wean over time. High risk of recurrence given inability to use either NSAIDs or colchcine. Literature varies quite a bit on suggested tapering regimens, likely would plan  slow gradual taper, perhaps 2.5 to 5mg  decrease every 2 weeks if remains asymptomatic and CRP negative prior to each adjustement.  - continue prednisone at current dose at this time. Chest pain resolved this AM   2.AKI - admit Cr 4.2, BUN 29   3.Vasovagal syncope - no further workup     Ok for discharge, we will arrange outpatient f/u.  For questions or updates,  please contact [facility] Please consult [email or web address] for contact info under        Signed, Dina Rich, MD  11/16/2022, 8:36 AM

## 2022-11-24 ENCOUNTER — Encounter: Payer: Self-pay | Admitting: Internal Medicine

## 2022-11-24 ENCOUNTER — Ambulatory Visit: Payer: Self-pay | Attending: Internal Medicine | Admitting: Internal Medicine

## 2022-11-24 VITALS — BP 140/70 | HR 112 | Ht 66.0 in | Wt 183.4 lb

## 2022-11-24 DIAGNOSIS — N184 Chronic kidney disease, stage 4 (severe): Secondary | ICD-10-CM | POA: Insufficient documentation

## 2022-11-24 DIAGNOSIS — Z79899 Other long term (current) drug therapy: Secondary | ICD-10-CM

## 2022-11-24 DIAGNOSIS — I319 Disease of pericardium, unspecified: Secondary | ICD-10-CM

## 2022-11-24 MED ORDER — PREDNISONE 5 MG PO TABS
ORAL_TABLET | ORAL | 0 refills | Status: AC
Start: 2022-11-25 — End: 2023-03-03

## 2022-11-24 NOTE — Patient Instructions (Addendum)
Medication Instructions:  Your physician has recommended you make the following change in your medication:  Take an additional 5 mg prednisone today to equal 20 mg since you've already taken 15 mg. Tomorrow start taking prednisone 17.5 mg daily for 2 weeks; then reduce to 15 mg daily for 2 weeks; then reduce to 12.5 mg daily for 2 weeks; then reduce to 10 mg daily for 2 weeks; then reduce to 7.5 mg daily for 2 weeks; then reduce to 5 mg daily for 2 weeks; then reduce to 2.5 mg daily for 2 weeks then stop it. Continue other medications as prescribed  Labwork: ESR & CRP every 2 weeks with every reduction of prednisone [facility] [address] [place], [place]  Testing/Procedures: none  Follow-Up: Your physician recommends that you schedule a follow-up appointment in: 4 months  Any Other Special Instructions Will Be Listed Below (If Applicable).  If you need a refill on your cardiac medications before your next appointment, please call your pharmacy.

## 2022-11-24 NOTE — Progress Notes (Signed)
Cardiology Office Note  Date: 11/24/2022   ID: Manuel Riggs, DOB 1991/01/23, MRN [id number]  PCP:  Patient, No Pcp Per  Cardiologist:  Marjo Bicker, MD Electrophysiologist:  None   Reason for Office Visit: Posthospitalization follow-up   History of Present Illness: Manuel Riggs is a 32 y.o. male known to have CKD stage IV is here for posthospitalization follow-up.  Patient was admitted in hospital with chest pain, positive for rhinovirus and found to have elevated inflammatory markers.  He was treated for viral pericarditis with p.o. steroids as he was not a candidate for NSAIDs or colchicine due to severely reduced GFR.  He is chest pain free and inflammatory markers normalized.  Overall doing great, no symptoms.  No DOE or chest pain.  No orthopnea, PND or leg swelling.  Past Medical History:  Diagnosis Date   Chronic back pain    Chronic leg pain    Kidney calculi     Past Surgical History:  Procedure Laterality Date   KIDNEY STONE SURGERY      Current Outpatient Medications  Medication Sig Dispense Refill   acetaminophen (TYLENOL) 325 MG tablet Take 2 tablets (650 mg total) by mouth every 6 (six) hours as needed for mild pain (or Fever >/= 101). 30 tablet 0   amLODipine (NORVASC) 10 MG tablet Take 1 tablet (10 mg total) by mouth daily. 30 tablet 3   guaiFENesin (ROBITUSSIN) 100 MG/5ML liquid Take 5 mLs by mouth every 4 (four) hours as needed for cough or to loosen phlegm. 120 mL 0   hydrALAZINE (APRESOLINE) 50 MG tablet Take 1 tablet (50 mg total) by mouth 3 (three) times daily. 90 tablet 3   isosorbide mononitrate (IMDUR) 30 MG 24 hr tablet Take 1 tablet (30 mg total) by mouth daily. 30 tablet 3   ondansetron (ZOFRAN) 4 MG tablet Take 1 tablet (4 mg total) by mouth every 8 (eight) hours as needed for nausea or vomiting. 20 tablet 1   pantoprazole (PROTONIX) 40 MG tablet Take 1 tablet (40 mg total) by mouth daily. 30 tablet 1   sodium bicarbonate 650 MG tablet  Take 1 tablet (650 mg total) by mouth 2 (two) times daily. 60 tablet 5   [START ON 11/25/2022] predniSONE (DELTASONE) 5 MG tablet Take 3.5 tablets (17.5 mg total) by mouth daily with breakfast for 14 days, THEN 3 tablets (15 mg total) daily with breakfast for 14 days, THEN 2.5 tablets (12.5 mg total) daily with breakfast for 14 days, THEN 2 tablets (10 mg total) daily with breakfast for 14 days, THEN 1.5 tablets (7.5 mg total) daily with breakfast for 14 days, THEN 1 tablet (5 mg total) daily with breakfast for 14 days, THEN 0.5 tablets (2.5 mg total) daily with breakfast for 14 days. 196 tablet 0   No current facility-administered medications for this visit.   Allergies:  Aspirin   Social History: The patient  reports that he has been smoking cigarettes. He does not have any smokeless tobacco history on file. He reports current drug use. Drug: Marijuana. He reports that he does not drink alcohol.   Family History: The patient's family history includes Hypertension in his mother.   ROS:  Please see the history of present illness. Otherwise, complete review of systems is positive for none  All other systems are reviewed and negative.   Physical Exam: VS:  BP (!) 140/70   Pulse (!) 112   Ht 5\' 6"  (1.676 m)  Wt 183 lb 6.4 oz (83.2 kg)   SpO2 97%   BMI 29.60 kg/m , BMI Body mass index is 29.6 kg/m.  Wt Readings from Last 3 Encounters:  11/24/22 183 lb 6.4 oz (83.2 kg)  11/12/22 169 lb 12.1 oz (77 kg)  11/07/22 164 lb 6.4 oz (74.6 kg)    General: Patient appears comfortable at rest. HEENT: Conjunctiva and lids normal, oropharynx clear with moist mucosa. Neck: Supple, no elevated JVP or carotid bruits, no thyromegaly. Lungs: Clear to auscultation, nonlabored breathing at rest. Cardiac: Regular rate and rhythm, no S3 or significant systolic murmur, no pericardial rub. Abdomen: Soft, nontender, no hepatomegaly, bowel sounds present, no guarding or rebound. Extremities: No pitting edema,  distal pulses 2+. Skin: Warm and dry. Musculoskeletal: No kyphosis. Neuropsychiatric: Alert and oriented x3, affect grossly appropriate.  Recent Labwork: 11/11/2022: B Natriuretic Peptide 68.0 11/15/2022: ALT 40; AST 22 11/16/2022: BUN 43; Creatinine, Ser 4.09; Hemoglobin 11.6; Magnesium 2.2; Platelets 323; Potassium 4.4; Sodium 137     Component Value Date/Time   CHOL 205 (H) 11/15/2022 0420   TRIG 97 11/15/2022 0420   HDL 51 11/15/2022 0420   CHOLHDL 4.0 11/15/2022 0420   VLDL 19 11/15/2022 0420   LDLCALC 135 (H) 11/15/2022 0420     Assessment and Plan:   # Viral pericarditis # CKD stage IV -Patient admitted to [facility] with chest pain, found to have elevated inflammatory markers and positive for rhinovirus.  He was treated for acute pericarditis with p.o. steroids (as he was not a candidate for NSAIDs or colchicine due to decreased GFR). Currently on prednisone 20 mg once daily. Chest pain-free since discharge and normal inflammatory markers. Will start the tapering regimen, decrease p.o. prednisone by 2.5 mg/day every 2 weeks and obtain inflammatory markers (ESR and CRP) with each new tapered dose.  I have spent a total of 30 minutes with patient reviewing chart, EKGs, labs and examining patient as well as establishing an assessment and plan that was discussed with the patient.  > 50% of time was spent in direct patient care.    Medication Adjustments/Labs and Tests Ordered: Current medicines are reviewed at length with the patient today.  Concerns regarding medicines are outlined above.   Tests Ordered: Orders Placed This Encounter  Procedures   Sed Rate (ESR)   C-reactive protein    Medication Changes: Meds ordered this encounter  Medications   predniSONE (DELTASONE) 5 MG tablet    Sig: Take 3.5 tablets (17.5 mg total) by mouth daily with breakfast for 14 days, THEN 3 tablets (15 mg total) daily with breakfast for 14 days, THEN 2.5 tablets (12.5 mg total) daily with  breakfast for 14 days, THEN 2 tablets (10 mg total) daily with breakfast for 14 days, THEN 1.5 tablets (7.5 mg total) daily with breakfast for 14 days, THEN 1 tablet (5 mg total) daily with breakfast for 14 days, THEN 0.5 tablets (2.5 mg total) daily with breakfast for 14 days.    Dispense:  196 tablet    Refill:  0    Disposition:  Follow up  4 months  Signed Nicholson Starace Verne Spurr, MD, 11/24/2022 11:43 AM    [facility] [address] [place], [place], [place] [address]

## 2023-03-28 ENCOUNTER — Ambulatory Visit: Payer: No Typology Code available for payment source | Attending: Internal Medicine | Admitting: Internal Medicine

## 2023-03-28 NOTE — Progress Notes (Signed)
Erroneous encounter - please disregard.

## 2023-03-29 ENCOUNTER — Encounter: Payer: Self-pay | Admitting: Internal Medicine

## 2023-09-09 ENCOUNTER — Emergency Department (HOSPITAL_COMMUNITY)
Admission: EM | Admit: 2023-09-09 | Discharge: 2023-09-10 | Disposition: A | Discharge status: Home / Self Care | Payer: Self-pay | Attending: Emergency Medicine | Admitting: Emergency Medicine

## 2023-09-09 ENCOUNTER — Other Ambulatory Visit: Payer: Self-pay

## 2023-09-09 ENCOUNTER — Encounter (HOSPITAL_COMMUNITY): Payer: Self-pay

## 2023-09-09 DIAGNOSIS — I10 Essential (primary) hypertension: Secondary | ICD-10-CM

## 2023-09-09 DIAGNOSIS — N184 Chronic kidney disease, stage 4 (severe): Secondary | ICD-10-CM | POA: Diagnosis not present

## 2023-09-09 DIAGNOSIS — N189 Chronic kidney disease, unspecified: Secondary | ICD-10-CM | POA: Diagnosis not present

## 2023-09-09 DIAGNOSIS — L500 Allergic urticaria: Secondary | ICD-10-CM | POA: Diagnosis not present

## 2023-09-09 DIAGNOSIS — I129 Hypertensive chronic kidney disease with stage 1 through stage 4 chronic kidney disease, or unspecified chronic kidney disease: Secondary | ICD-10-CM | POA: Diagnosis not present

## 2023-09-09 DIAGNOSIS — T7840XA Allergy, unspecified, initial encounter: Secondary | ICD-10-CM

## 2023-09-09 DIAGNOSIS — Z79899 Other long term (current) drug therapy: Secondary | ICD-10-CM | POA: Insufficient documentation

## 2023-09-09 DIAGNOSIS — R21 Rash and other nonspecific skin eruption: Secondary | ICD-10-CM | POA: Diagnosis not present

## 2023-09-09 LAB — BASIC METABOLIC PANEL WITH GFR
Anion gap: 8 (ref 5–15)
BUN: 54 mg/dL — ABNORMAL HIGH (ref 6–20)
CO2: 22 mmol/L (ref 22–32)
Calcium: 8.4 mg/dL — ABNORMAL LOW (ref 8.9–10.3)
Chloride: 106 mmol/L (ref 98–111)
Creatinine, Ser: 4.94 mg/dL — ABNORMAL HIGH (ref 0.61–1.24)
GFR, Estimated: 15 mL/min — ABNORMAL LOW (ref >60)
Glucose, Bld: 98 mg/dL (ref 70–99)
Potassium: 4.1 mmol/L (ref 3.5–5.1)
Sodium: 136 mmol/L (ref 135–145)

## 2023-09-09 LAB — CBC
HCT: 37.8 % — ABNORMAL LOW (ref 39.0–52.0)
Hemoglobin: 12.4 g/dL — ABNORMAL LOW (ref 13.0–17.0)
MCH: 29 pg (ref 26.0–34.0)
MCHC: 32.8 g/dL (ref 30.0–36.0)
MCV: 88.3 fL (ref 80.0–100.0)
Platelets: 291 10*3/uL (ref 150–400)
RBC: 4.28 MIL/uL (ref 4.22–5.81)
RDW: 13.9 % (ref 11.5–15.5)
WBC: 8 10*3/uL (ref 4.0–10.5)
nRBC: 0 % (ref 0.0–0.2)

## 2023-09-09 MED ORDER — FAMOTIDINE IN NACL 20-0.9 MG/50ML-% IV SOLN
20.0000 mg | Freq: Once | INTRAVENOUS | Status: AC
  Administered 2023-09-09: 20 mg via INTRAVENOUS
  Filled 2023-09-09: qty 50

## 2023-09-09 MED ORDER — DIPHENHYDRAMINE HCL 50 MG/ML IJ SOLN
25.0000 mg | Freq: Once | INTRAMUSCULAR | Status: AC
  Administered 2023-09-09: 25 mg via INTRAVENOUS
  Filled 2023-09-09: qty 1

## 2023-09-09 MED ORDER — METHYLPREDNISOLONE SODIUM SUCC 125 MG IJ SOLR
125.0000 mg | Freq: Once | INTRAMUSCULAR | Status: AC
  Administered 2023-09-09: 125 mg via INTRAVENOUS
  Filled 2023-09-09: qty 2

## 2023-09-09 MED ORDER — HYDRALAZINE HCL 50 MG PO TABS: 50.0000 mg | ORAL_TABLET | Freq: Three times a day (TID) | ORAL | 3 refills | Status: DC

## 2023-09-09 MED ORDER — AMLODIPINE BESYLATE 10 MG PO TABS: 10.0000 mg | ORAL_TABLET | Freq: Every day | ORAL | 3 refills | Status: AC

## 2023-09-09 NOTE — ED Notes (Signed)
 ED Provider at bedside.

## 2023-09-09 NOTE — ED Provider Notes (Signed)
 [place] [facility] AT [facility] Provider Note   CSN: [id number] Arrival date & time: 09/09/23  2238     History  Chief Complaint  Patient presents with   Allergic Reaction    Allergy to mold. Swollen tongue, itching.    Manuel Riggs is a 33 y.o. male.  Pt is a 33 yo with pmhx significant for ckd, htn, and gerd.  Pt said said he developed an allergic rxn after exposure to mold tonight.  He has a rash and feels like his tongue is swollen.  He has pain when he swallows.  Pt has not been taking his medication and is hypertensive.        Home Medications Prior to Admission medications   Medication Sig Start Date End Date Taking? Authorizing Provider  acetaminophen  (TYLENOL ) 325 MG tablet Take 2 tablets (650 mg total) by mouth every 6 (six) hours as needed for mild pain (or Fever >/= 101). 11/11/22   Colin Dawley, MD  amLODipine  (NORVASC ) 10 MG tablet Take 1 tablet (10 mg total) by mouth daily. 09/09/23   Sueellen Emery, MD  guaiFENesin  (ROBITUSSIN) 100 MG/5ML liquid Take 5 mLs by mouth every 4 (four) hours as needed for cough or to loosen phlegm. 11/16/22   Mason Sole, Pratik D, DO  hydrALAZINE  (APRESOLINE ) 50 MG tablet Take 1 tablet (50 mg total) by mouth 3 (three) times daily. 09/09/23   Evony Rezek, MD  isosorbide  mononitrate (IMDUR ) 30 MG 24 hr tablet Take 1 tablet (30 mg total) by mouth daily. 11/16/22   Mason Sole, Pratik D, DO  ondansetron  (ZOFRAN ) 4 MG tablet Take 1 tablet (4 mg total) by mouth every 8 (eight) hours as needed for nausea or vomiting. 11/11/22 11/11/23  Colin Dawley, MD  pantoprazole  (PROTONIX ) 40 MG tablet Take 1 tablet (40 mg total) by mouth daily. 11/17/22 01/16/23  Mason Sole, Pratik D, DO  sodium bicarbonate  650 MG tablet Take 1 tablet (650 mg total) by mouth 2 (two) times daily. 11/11/22   Colin Dawley, MD      Allergies    Molds & smuts and Aspirin    Review of Systems   Review of Systems  Skin:  Positive for rash.  All other systems  reviewed and are negative.   Physical Exam Updated Vital Signs BP (!) 167/93   Pulse 71   Temp 98.2 F (36.8 C) (Oral)   Resp 18   Ht 5\' 6"  (1.676 m)   Wt 83 kg   SpO2 96%   BMI 29.54 kg/m  Physical Exam Vitals and nursing note reviewed.  Constitutional:      Appearance: Normal appearance.  HENT:     Head: Normocephalic and atraumatic.     Comments: No angioedema or noticeable swelling.  Pt does have + left cervical LAD.     Right Ear: External ear normal.     Left Ear: External ear normal.     Nose: Nose normal.     Mouth/Throat:     Mouth: Mucous membranes are moist. No angioedema.     Pharynx: Oropharynx is clear.  Eyes:     Extraocular Movements: Extraocular movements intact.     Pupils: Pupils are equal, round, and reactive to light.  Cardiovascular:     Rate and Rhythm: Normal rate and regular rhythm.     Pulses: Normal pulses.     Heart sounds: Normal heart sounds.  Pulmonary:     Effort: Pulmonary effort is normal.     Breath  sounds: Normal breath sounds.  Abdominal:     General: Abdomen is flat. Bowel sounds are normal.     Palpations: Abdomen is soft.  Musculoskeletal:        General: Normal range of motion.     Cervical back: Normal range of motion and neck supple.  Lymphadenopathy:     Cervical: Cervical adenopathy present.     Left cervical: Superficial cervical adenopathy present.  Skin:    Capillary Refill: Capillary refill takes less than 2 seconds.     Comments: Urticarial rash to upper chest and face.  Neurological:     General: No focal deficit present.     Mental Status: He is alert and oriented to person, place, and time.  Psychiatric:        Mood and Affect: Mood normal.        Behavior: Behavior normal.     ED Results / Procedures / Treatments   Labs (all labs ordered are listed, but only abnormal results are displayed) Labs Reviewed  BASIC METABOLIC PANEL WITH GFR - Abnormal; Notable for the following components:      Result  Value   BUN 54 (*)    Creatinine, Ser 4.94 (*)    Calcium 8.4 (*)    GFR, Estimated 15 (*)    All other components within normal limits  CBC - Abnormal; Notable for the following components:   Hemoglobin 12.4 (*)    HCT 37.8 (*)    All other components within normal limits  GROUP A STREP BY PCR    EKG None  Radiology No results found.  Procedures Procedures    Medications Ordered in ED Medications  diphenhydrAMINE  (BENADRYL ) injection 25 mg (25 mg Intravenous Given 09/09/23 2257)  methylPREDNISolone  sodium succinate (SOLU-MEDROL ) 125 mg/2 mL injection 125 mg (125 mg Intravenous Given 09/09/23 2258)  famotidine  (PEPCID ) IVPB 20 mg premix (0 mg Intravenous Stopped 09/09/23 2316)    ED Course/ Medical Decision Making/ A&P                                 Medical Decision Making Amount and/or Complexity of Data Reviewed Labs: ordered.  Risk Prescription drug management.   This patient presents to the ED for concern of allergic reaction, this involves an extensive number of treatment options, and is a complaint that carries with it a high risk of complications and morbidity.  The differential diagnosis includes anaphylaxis, allergies   Co morbidities that complicate the patient evaluation  ckd, htn, and gerd   Additional history obtained:  Additional history obtained from epic chart review External records from outside source obtained and reviewed including family   Lab Tests:  I Ordered, and personally interpreted labs.  The pertinent results include:  cbc with hgb sl low at 12.4 (chronic); bmp with bun 54 and cr 4.94   Cardiac Monitoring:  The patient was maintained on a cardiac monitor.  I personally viewed and interpreted the cardiac monitored which showed an underlying rhythm of: nsr   Medicines ordered and prescription drug management:  I ordered medication including pepcid , solumedrol, benadryl   for sx  Reevaluation of the patient after these  medicines showed that the patient improved I have reviewed the patients home medicines and have made adjustments as needed  Problem List / ED Course:  Allergic rxn:  sx have improved, but he will need to be watched for a few more hours.  Pt  signed out at shift change. HTN and CKD:  chronic.  Pt has not been compliant with his meds.  He has not followed up with cards/nephrology.  Pt told to f/u with them.   Reevaluation:  After the interventions noted above, I reevaluated the patient and found that they have :improved   Social Determinants of Health:  Lives at home   Dispostion:  Pending at shift change.        Final Clinical Impression(s) / ED Diagnoses Final diagnoses:  Allergic reaction, initial encounter  Hypertension, unspecified type  Stage 4 chronic kidney disease (HCC)    Rx / DC Orders ED Discharge Orders          Ordered    amLODipine  (NORVASC ) 10 MG tablet  Daily        09/09/23 2350    hydrALAZINE  (APRESOLINE ) 50 MG tablet  3 times daily        09/09/23 2350              Sueellen Emery, MD 09/09/23 2353

## 2023-09-09 NOTE — ED Triage Notes (Signed)
 Patient c/o allergic reaction (tongue swelling, hives, itching) from contact with mold today around 2200 hrs, pain when swallows saliva 5/10.

## 2023-09-10 LAB — GROUP A STREP BY PCR: Group A Strep by PCR: NOT DETECTED

## 2023-09-10 NOTE — ED Provider Notes (Signed)
 I assumed care at signout to monitor patient.  He is in no acute distress resting comfortably with a washcloth on his forehead.  There is no angioedema or no stridor.  He is protecting his airway, there is no drooling.  There is no rash anywhere on his body.  There is no wheezing.  At this time there is no signs of anaphylaxis.  Patient is safe for discharge.  He reports this occurred when he opened the fridge and had mold in it.  Denies any other obvious exposures that could have caused this.  Denies previous history of the symptoms  Patient will be referred to local allergist  Discussed at length the need for him to take his blood pressure medicines and his worsening kidney failure.  Patient was advised that if his blood pressure is not managed and his kidney function worsens, he may need dialysis at a later date   Eldon Greenland, MD 09/10/23 [phone]

## 2024-01-24 DIAGNOSIS — Z79899 Other long term (current) drug therapy: Secondary | ICD-10-CM | POA: Diagnosis not present

## 2024-01-24 DIAGNOSIS — Z87448 Personal history of other diseases of urinary system: Secondary | ICD-10-CM | POA: Diagnosis not present

## 2024-01-24 DIAGNOSIS — E860 Dehydration: Secondary | ICD-10-CM | POA: Diagnosis not present

## 2024-01-24 DIAGNOSIS — N184 Chronic kidney disease, stage 4 (severe): Secondary | ICD-10-CM | POA: Diagnosis not present

## 2024-01-24 DIAGNOSIS — Z886 Allergy status to analgesic agent status: Secondary | ICD-10-CM | POA: Diagnosis not present

## 2024-01-24 DIAGNOSIS — N189 Chronic kidney disease, unspecified: Secondary | ICD-10-CM | POA: Diagnosis not present

## 2024-01-24 DIAGNOSIS — R7989 Other specified abnormal findings of blood chemistry: Secondary | ICD-10-CM | POA: Diagnosis not present

## 2024-01-24 DIAGNOSIS — Z905 Acquired absence of kidney: Secondary | ICD-10-CM | POA: Diagnosis not present

## 2024-01-24 DIAGNOSIS — R112 Nausea with vomiting, unspecified: Secondary | ICD-10-CM | POA: Diagnosis not present

## 2024-01-24 DIAGNOSIS — I129 Hypertensive chronic kidney disease with stage 1 through stage 4 chronic kidney disease, or unspecified chronic kidney disease: Secondary | ICD-10-CM | POA: Diagnosis not present

## 2024-01-24 DIAGNOSIS — N261 Atrophy of kidney (terminal): Secondary | ICD-10-CM | POA: Diagnosis not present

## 2024-01-24 DIAGNOSIS — F1290 Cannabis use, unspecified, uncomplicated: Secondary | ICD-10-CM | POA: Diagnosis not present

## 2024-01-24 DIAGNOSIS — N179 Acute kidney failure, unspecified: Secondary | ICD-10-CM | POA: Diagnosis not present

## 2024-01-24 DIAGNOSIS — Z87891 Personal history of nicotine dependence: Secondary | ICD-10-CM | POA: Diagnosis not present

## 2024-01-24 DIAGNOSIS — R748 Abnormal levels of other serum enzymes: Secondary | ICD-10-CM | POA: Diagnosis not present

## 2024-01-26 NOTE — Nursing Note (Signed)
 Iv removed and discharge instructions given. Patient walked to car escorted by  me

## 2024-02-27 ENCOUNTER — Other Ambulatory Visit (HOSPITAL_COMMUNITY)
Admission: RE | Admit: 2024-02-27 | Discharge: 2024-02-27 | Disposition: A | Discharge status: Home / Self Care | Payer: Self-pay | Source: Ambulatory Visit | Attending: Nephrology | Admitting: Nephrology

## 2024-02-27 DIAGNOSIS — N184 Chronic kidney disease, stage 4 (severe): Secondary | ICD-10-CM | POA: Diagnosis not present

## 2024-02-27 DIAGNOSIS — R809 Proteinuria, unspecified: Secondary | ICD-10-CM | POA: Diagnosis not present

## 2024-02-27 DIAGNOSIS — D631 Anemia in chronic kidney disease: Secondary | ICD-10-CM | POA: Insufficient documentation

## 2024-02-27 LAB — CBC
HCT: 39.7 % (ref 39.0–52.0)
Hemoglobin: 12.9 g/dL — ABNORMAL LOW (ref 13.0–17.0)
MCH: 28.6 pg (ref 26.0–34.0)
MCHC: 32.5 g/dL (ref 30.0–36.0)
MCV: 88 fL (ref 80.0–100.0)
Platelets: 257 K/uL (ref 150–400)
RBC: 4.51 MIL/uL (ref 4.22–5.81)
RDW: 13.7 % (ref 11.5–15.5)
WBC: 6.6 K/uL (ref 4.0–10.5)
nRBC: 0 % (ref 0.0–0.2)

## 2024-02-27 LAB — VITAMIN B12: Vitamin B-12: 473 pg/mL (ref 180–914)

## 2024-02-27 LAB — IRON AND TIBC
Iron: 56 ug/dL (ref 45–182)
Saturation Ratios: 19 % (ref 17.9–39.5)
TIBC: 298 ug/dL (ref 250–450)
UIBC: 242 ug/dL

## 2024-02-27 LAB — RENAL FUNCTION PANEL
Albumin: 4.3 g/dL (ref 3.5–5.0)
Anion gap: 12 (ref 5–15)
BUN: 54 mg/dL — ABNORMAL HIGH (ref 6–20)
CO2: 22 mmol/L (ref 22–32)
Calcium: 9 mg/dL (ref 8.9–10.3)
Chloride: 104 mmol/L (ref 98–111)
Creatinine, Ser: 6.42 mg/dL — ABNORMAL HIGH (ref 0.61–1.24)
GFR, Estimated: 11 mL/min — ABNORMAL LOW (ref >60)
Glucose, Bld: 86 mg/dL (ref 70–99)
Phosphorus: 3.5 mg/dL (ref 2.5–4.6)
Potassium: 5 mmol/L (ref 3.5–5.1)
Sodium: 138 mmol/L (ref 135–145)

## 2024-02-27 LAB — FOLATE: Folate: 8.3 ng/mL (ref >5.9)

## 2024-02-27 LAB — HEMOGLOBIN A1C
Hgb A1c MFr Bld: 4.9 % (ref 4.8–5.6)
Mean Plasma Glucose: 93.93 mg/dL

## 2024-02-27 LAB — PROTEIN / CREATININE RATIO, URINE
Creatinine, Urine: 115 mg/dL
Protein Creatinine Ratio: 2.92 mg/mg{creat} — ABNORMAL HIGH (ref 0.00–0.15)
Total Protein, Urine: 336 mg/dL

## 2024-02-27 LAB — FERRITIN: Ferritin: 135 ng/mL (ref 24–336)

## 2024-02-28 LAB — PARATHYROID HORMONE, INTACT (NO CA): PTH: 234 pg/mL — ABNORMAL HIGH (ref 15–65)

## 2024-02-29 LAB — MISC LABCORP TEST (SEND OUT)
Labcorp test code: 121265
Labcorp test code: 144050
Labcorp test code: 6395
Labcorp test code: 6510
Labcorp test code: 6718

## 2024-02-29 LAB — QUANTIFERON-TB GOLD PLUS (RQFGPL)
QuantiFERON Mitogen Value: >10 [IU]/mL
QuantiFERON Nil Value: 0.07 [IU]/mL
QuantiFERON TB1 Ag Value: 0.06 [IU]/mL
QuantiFERON TB2 Ag Value: 0.07 [IU]/mL

## 2024-02-29 LAB — HCV INTERPRETATION

## 2024-02-29 LAB — HCV AB W REFLEX TO QUANT PCR: HCV Ab: NONREACTIVE

## 2024-02-29 LAB — QUANTIFERON-TB GOLD PLUS: QuantiFERON-TB Gold Plus: NEGATIVE

## 2024-02-29 LAB — HEPATITIS B CORE ANTIBODY, TOTAL: HEP B CORE AB: NEGATIVE

## 2024-03-20 ENCOUNTER — Ambulatory Visit (INDEPENDENT_AMBULATORY_CARE_PROVIDER_SITE_OTHER): Payer: Self-pay

## 2024-03-20 ENCOUNTER — Encounter (HOSPITAL_COMMUNITY): Payer: Self-pay

## 2024-03-20 ENCOUNTER — Other Ambulatory Visit: Payer: Self-pay

## 2024-03-20 DIAGNOSIS — N184 Chronic kidney disease, stage 4 (severe): Secondary | ICD-10-CM

## 2024-03-21 ENCOUNTER — Ambulatory Visit (HOSPITAL_COMMUNITY)
Admission: RE | Admit: 2024-03-21 | Discharge: 2024-03-21 | Disposition: A | Discharge status: Home / Self Care | Payer: Self-pay | Attending: Vascular Surgery | Admitting: Vascular Surgery

## 2024-03-21 ENCOUNTER — Other Ambulatory Visit: Payer: Self-pay

## 2024-03-21 ENCOUNTER — Encounter (HOSPITAL_COMMUNITY)
Admission: RE | Disposition: A | Discharge status: Home / Self Care | Payer: Self-pay | Source: Home / Self Care | Attending: Vascular Surgery

## 2024-03-21 DIAGNOSIS — Z992 Dependence on renal dialysis: Secondary | ICD-10-CM | POA: Diagnosis not present

## 2024-03-21 DIAGNOSIS — N186 End stage renal disease: Secondary | ICD-10-CM | POA: Diagnosis not present

## 2024-03-21 HISTORY — PX: DIALYSIS/PERMA CATHETER INSERTION: CATH118288

## 2024-03-21 SURGERY — DIALYSIS/PERMA CATHETER INSERTION
Anesthesia: LOCAL | Site: Chest

## 2024-03-21 MED ORDER — HEPARIN (PORCINE) IN NACL 1000-0.9 UT/500ML-% IV SOLN
INTRAVENOUS | Status: DC | PRN
  Administered 2024-03-21: 500 mL

## 2024-03-21 MED ORDER — FENTANYL CITRATE (PF) 100 MCG/2ML IJ SOLN
INTRAMUSCULAR | Status: DC | PRN
  Administered 2024-03-21: 50 ug via INTRAVENOUS

## 2024-03-21 MED ORDER — MIDAZOLAM HCL 2 MG/2ML IJ SOLN
INTRAMUSCULAR | Status: AC
  Filled 2024-03-21: qty 2

## 2024-03-21 MED ORDER — FENTANYL CITRATE (PF) 100 MCG/2ML IJ SOLN
INTRAMUSCULAR | Status: AC
  Filled 2024-03-21: qty 2

## 2024-03-21 MED ORDER — LIDOCAINE HCL (PF) 1 % IJ SOLN
INTRAMUSCULAR | Status: DC | PRN
Start: 2024-03-21 — End: 2024-03-21
  Administered 2024-03-21: 5 mL via SUBCUTANEOUS
  Administered 2024-03-21: 10 mL via SUBCUTANEOUS

## 2024-03-21 MED ORDER — MIDAZOLAM HCL (PF) 2 MG/2ML IJ SOLN
INTRAMUSCULAR | Status: DC | PRN
Start: 2024-03-21 — End: 2024-03-21
  Administered 2024-03-21: 1 mg via INTRAVENOUS

## 2024-03-21 MED ORDER — LIDOCAINE HCL (PF) 1 % IJ SOLN
INTRAMUSCULAR | Status: AC
  Filled 2024-03-21: qty 30

## 2024-03-21 MED ORDER — HEPARIN SODIUM (PORCINE) 1000 UNIT/ML IJ SOLN
INTRAMUSCULAR | Status: AC
  Filled 2024-03-21: qty 10

## 2024-03-21 MED ORDER — HEPARIN SODIUM (PORCINE) 1000 UNIT/ML IJ SOLN
INTRAMUSCULAR | Status: DC | PRN
Start: 2024-03-21 — End: 2024-03-21
  Administered 2024-03-21 (×2): 1600 [IU] via INTRAVENOUS

## 2024-03-21 SURGICAL SUPPLY — 6 items
CATH PALINDROME-P 19CM W/VT (CATHETERS) IMPLANT
KIT MICROPUNCTURE NIT STIFF (SHEATH) IMPLANT
SHEATH PROBE COVER 6X72 (BAG) IMPLANT
STOPCOCK MORSE 400PSI 3WAY (MISCELLANEOUS) IMPLANT
TRAY PV CATH (CUSTOM PROCEDURE TRAY) ×1 IMPLANT
TUBING CIL FLEX 10 FLL-RA (TUBING) IMPLANT

## 2024-03-21 NOTE — Op Note (Signed)
    Patient name: Manuel Riggs MRN: [id number] DOB: 1991-03-27 Sex: male  03/21/2024 Pre-operative Diagnosis: ESRD on HD Post-operative diagnosis:  Same Surgeon:  Norman GORMAN Serve, MD Procedure Performed:  Ultrasound and fluoroscopic guided right IJ tunneled dialysis catheter placement 10 minutes moderate sedation with fentanyl and Versed  Indications: [name] is a 33 year old male presenting for a tunneled dialysis catheter placement to the HD access center.  He has plans to start dialysis tomorrow.  We reviewed the risks and benefits of TDC placement and he elected to proceed.  Findings:  Compressible right internal jugular Successful placement of right IJ TDC at atriocaval junction   Procedure:   The patient was brought to the operating room positioned supine on operating room table.  The neck was prepped and draped in the usual sterile fashion.  A time out was called. Using ultrasound guidance the right internal jugular vein was accessed with micropuncture technique.  Through the micropuncture sheath, the guidewire was advanced into the superior vena cava.  A small incision was made around the skin access point.  The access point was serially dilated under direct fluoroscopic guidance.  A peel-away sheath was introduced into the superior vena cava under fluoroscopic guidance.  A counterincision was made in the chest under the clavicle.  A 19 cm tunnel dialysis catheter was then tunneled under the skin, over the clavicle into the incision in the neck.  The tunneling device was removed and the catheter fed through the peel-away sheath into the superior vena cava.  The peel-away sheath was removed and the catheter gently pulled back.  Adequate position was confirmed with x-ray.  The catheter was tested and found to aspirate and flush with ease.    The catheter was sutured to the skin and the neck incision was closed with 4-0 Monocryl.  The catheter was then capped and heparin   locked.   Contrast: 0 Sedation: 10 minutes  Impression: Successful placement of right IJ TDC. He has had his vein mapping done and showed to have an upcoming office visit to discuss permanent access creation.   Norman GORMAN Serve MD [facility] Office: [phone]

## 2024-03-21 NOTE — H&P (Signed)
  HD ACCESS CENTER H&P   Patient ID: Manuel Riggs, male   DOB: Dec 20, 1990, 33 y.o.   MRN: [id number]  Subjective:     HPI Manuel Riggs is a 33 y.o. male with ESRD presenting to the HD access center for intervention.  Past Medical History:  Diagnosis Date   Chronic back pain    Chronic leg pain    Kidney calculi    Family History  Problem Relation Age of Onset   Hypertension Mother    Past Surgical History:  Procedure Laterality Date   KIDNEY STONE SURGERY      Short Social History:  Social History   Tobacco Use   Smoking status: Every Day    Types: Cigarettes   Smokeless tobacco: Not on file  Substance Use Topics   Alcohol use: No    Allergies  Allergen Reactions   Molds & Smuts Anaphylaxis    Swollen tongue, hives, itching   Aspirin     Break out    No current facility-administered medications for this encounter.    REVIEW OF SYSTEMS All other systems were reviewed and are negative     Objective:   Objective   Vitals:   03/21/24 0941 03/21/24 0954  BP: (!) 156/95 (!) 154/101  Pulse: 92 90  Resp: 12 14  Temp: 98.2 F (36.8 C)   TempSrc: Oral   SpO2: 98% 97%   There is no height or weight on file to calculate BMI.  Physical Exam General: no acute distress Cardiac: hemodynamically stable     Assessment/Plan:   Manuel Riggs is a 33 y.o. male with ESRD presenting for [facility] placement.  Plans to start dialysis tomorrow Reviewed risks and benefits of tunneled dialysis catheter placement and patient agreed to proceed.   Norman Serve, MD [facility]

## 2024-03-22 ENCOUNTER — Encounter (HOSPITAL_COMMUNITY): Payer: Self-pay | Admitting: Vascular Surgery

## 2024-03-22 DIAGNOSIS — Z992 Dependence on renal dialysis: Secondary | ICD-10-CM

## 2024-03-22 HISTORY — DX: Dependence on renal dialysis: Z99.2

## 2024-03-29 NOTE — Progress Notes (Signed)
 Referral received. Care Everywhere initiated. Records reviewed. Pt is okay for education.  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 Rachele

## 2024-04-03 NOTE — Telephone Encounter (Signed)
-----   Message from Jon ORN sent at 04/03/2024  1:16 PM EST ----- Called patient to complete intake and schedule education. Phone goes straight to message stating that voicemail has not been set up.    Alice, can you please reach out to referring office to let them know that we can't reach him and can not leave a voicemail?  Thanks, Angie

## 2024-04-03 NOTE — Telephone Encounter (Signed)
 Called patient to complete intake and schedule transplant education.  Phone goes straight to message stating that voicemail has not been set up yet.  Inbasket sent to outreach coordinator to request that she reach out to referring office to let them know that we have not been able to reach patient and can not leave a voicemail.

## 2024-04-03 NOTE — Telephone Encounter (Signed)
 Msg sent to Annabella Bash to help contact patient to return call to schedule for transplant education

## 2024-04-04 NOTE — Telephone Encounter (Signed)
 CKD/ESRD 2/2   obstructive uropathy   Dialysis Center: [facility]  Nephrologist: Gaynell Clos, MD   Demographics verified? Yes   Do you use a walker or w/c   No   Are you able to climb stairs?    Yes   Copy of fit test sent? No     Do you have diabetes? No   When were you diagnosed with diabetes? N/A    Does Pt meet criteria for Pancreas evaluation?  (BMI<35, age <65, insulin dependent) No.   Is the Pt interested in Pancreas transplant? No.   Have you ever had a stroke? (If yes: year & functional deficits)      No   Have you had a heart attack or cardiac interventions? (If yes: year and location) No    Do you take any blood thinners?   No   Do you have any history of cancer? (Type, when and treatment?)  No   Do you use oxygen: No   Other Hx: Referral received. Care Everywhere initiated. Records reviewed. Pt is okay for education.   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 Clos   Are you legally blind? No   Do you drive yourself? (If not who does?) No- significant other     Have you ever had any amputations? No   Do you have any current injuries/infections: No   Do you use illicit drugs/ETOH? No  Have you ever used tobacco? Yes   What type? Cigarettes    How long? Many years- started at age 53 and stopped one year ago   Foreign Born No   Who do you live with? Significant other and her family     Other support: spouse     Recent outside studies:  CT ABD/PEL Yes   EKG: unsure    CXR No   Stress Test No   Have you completed other transplant evaluations/are you listed anywhere else? No   Health Maintenance   >45 When was your last colonoscopy, where?  N/A    When was your last dental  exam, where?  Unsure    Male >40 When was your last mammogram, where? N/A    Male When was your last pap, where? N/A    Encouraged Pt to think of family and/or friends who may be a living donor for pt. Instructed Pt that he will be given living donor information in his education class.  Patient scheduled for transplant education on April 23, 2024 at [place].

## 2024-04-16 NOTE — Progress Notes (Unsigned)
 Patient name: Manuel Riggs MRN: [id number] DOB: [birth year] Sex: male  REASON FOR CONSULT: Permanent access creation  HPI: Manuel Riggs is a 33 y.o. male, with history of end-stage renal disease that presents for evaluation of permanent hemodialysis access.  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  Recently had a right IJ TDC placed on 03/21/2024 and started on dialysis on 03/22/2024.  Patient states he is right-handed.  No prior access in the past.  Only chest wall implant is the right IJ catheter.  States the catheter is working well.  Past Medical History:  Diagnosis Date   Chronic back pain    Chronic leg pain    Kidney calculi     Past Surgical History:  Procedure Laterality Date   DIALYSIS/PERMA CATHETER INSERTION N/A 03/21/2024   Procedure: DIALYSIS/PERMA CATHETER INSERTION;  Surgeon: Pearline Norman RAMAN, MD;  Location: [facility];  Service: Cardiovascular;  Laterality: N/A;   KIDNEY STONE SURGERY      Family History  Problem Relation Age of Onset   Hypertension Mother     SOCIAL HISTORY: Social History   Socioeconomic History   Marital status: Single    Spouse name: Not on file   Number of children: Not on file   Years of education: Not on file   Highest education level: Not on file  Occupational History   Not on file  Tobacco Use   Smoking status: Every Day    Types: Cigarettes   Smokeless tobacco: Not on file  Substance and Sexual Activity   Alcohol use: No   Drug use: Yes    Types: Marijuana   Sexual activity: Not Currently  Other Topics Concern   Not on file  Social History Narrative   Not on file   Social Drivers of Health   Financial Resource Strain: Low Risk (01/24/2024)   Received from [facility]   Overall Financial Resource Strain (CARDIA)    How hard is it for you to pay for the very basics like food, housing, medical care, and heating?: Not hard at all  Food Insecurity: No Food  Insecurity (01/24/2024)   Received from [facility]   Hunger Vital Sign    Within the past 12 months, you worried that your food would run out before you got the money to buy more.: Never true    Within the past 12 months, the food you bought just didn't last and you didn't have money to get more.: Never true  Transportation Needs: No Transportation Needs (01/24/2024)   Received from [facility]   PRAPARE - Transportation    Lack of Transportation (Medical): No    Lack of Transportation (Non-Medical): No  Physical Activity: Not on file  Stress: Not on file  Social Connections: Not on file  Intimate Partner Violence: Not At Risk (01/24/2024)   Received from [facility]   Humiliation, Afraid, Rape, and Kick questionnaire    Within the last year, have you been afraid of your partner or ex-partner?: No    Within the last year, have you been humiliated or emotionally abused in other ways by your partner or ex-partner?: No    Within the last year, have you been kicked, hit, slapped, or otherwise physically hurt by your partner or ex-partner?: No    Within the last year, have you been raped or forced to have any kind of sexual activity by your  partner or ex-partner?: No    Allergies  Allergen Reactions   Molds & Smuts Anaphylaxis    Swollen tongue, hives, itching   Aspirin     Break out    Current Outpatient Medications  Medication Sig Dispense Refill   acetaminophen  (TYLENOL ) 325 MG tablet Take 2 tablets (650 mg total) by mouth every 6 (six) hours as needed for mild pain (or Fever >/= 101). 30 tablet 0   amLODipine  (NORVASC ) 10 MG tablet Take 1 tablet (10 mg total) by mouth daily. 30 tablet 3   guaiFENesin  (ROBITUSSIN) 100 MG/5ML liquid Take 5 mLs by mouth every 4 (four) hours as needed for cough or to loosen phlegm. 120 mL 0   hydrALAZINE  (APRESOLINE ) 50 MG tablet Take 1 tablet (50 mg total) by mouth 3 (three) times daily. 90 tablet 3   isosorbide  mononitrate (IMDUR ) 30 MG 24 hr  tablet Take 1 tablet (30 mg total) by mouth daily. 30 tablet 3   pantoprazole  (PROTONIX ) 40 MG tablet Take 1 tablet (40 mg total) by mouth daily. 30 tablet 1   sodium bicarbonate  650 MG tablet Take 1 tablet (650 mg total) by mouth 2 (two) times daily. 60 tablet 5   No current facility-administered medications for this visit.    REVIEW OF SYSTEMS:  [X]  denotes positive finding, [ ]  denotes negative finding Cardiac  Comments:  Chest pain or chest pressure:    Shortness of breath upon exertion:    Short of breath when lying flat:    Irregular heart rhythm:        Vascular    Pain in calf, thigh, or hip brought on by ambulation:    Pain in feet at night that wakes you up from your sleep:     Blood clot in your veins:    Leg swelling:         Pulmonary    Oxygen at home:    Productive cough:     Wheezing:         Neurologic    Sudden weakness in arms or legs:     Sudden numbness in arms or legs:     Sudden onset of difficulty speaking or slurred speech:    Temporary loss of vision in one eye:     Problems with dizziness:         Gastrointestinal    Blood in stool:     Vomited blood:         Genitourinary    Burning when urinating:     Blood in urine:        Psychiatric    Major depression:         Hematologic    Bleeding problems:    Problems with blood clotting too easily:        Skin    Rashes or ulcers:        Constitutional    Fever or chills:      PHYSICAL EXAM: There were no vitals filed for this visit.  GENERAL: The patient is a well-nourished male, in no acute distress. The vital signs are documented above. CARDIAC: There is a regular rate and rhythm.  VASCULAR:  Bilateral radial pulses palpable Right IJ TDC PULMONARY: No respiratory distress. ABDOMEN: Soft and non-tender. MUSCULOSKELETAL: There are no major deformities or cyanosis. NEUROLOGIC: No focal weakness or paresthesias are detected. PSYCHIATRIC: The patient has a normal affect.  DATA:    UPPER EXTREMITY VEIN MAPPING  Patient Name:  Manuel Riggs  Manuel Riggs  Date of Exam:   03/20/2024 Medical Rec #: [id number]      Accession #:    [id number] Date of Birth: [birth year]      Patient Gender: M Patient Age:   1 years Exam Location:  [address] Procedure:      VAS US  UPPER EXT VEIN MAPPING (PRE-OP  AVF) Referring Phys: NORMAN SERVE   --------------------------------------------------------------------------- -----   Indications: Pre-access.  Performing Technologist: King Pierre RVT    Examination Guidelines: A complete evaluation includes B-mode imaging, spectral Doppler, color Doppler, and power Doppler as needed of all accessible portions of each vessel. Bilateral testing is considered an integral part of a complete examination. Limited examinations for reoccurring indications may be performed as noted.  +-----------------+-------------+----------+--------------+ Right Cephalic   Diameter (cm)Depth (cm)   Findings    +-----------------+-------------+----------+--------------+ Prox upper arm       0.17                              +-----------------+-------------+----------+--------------+ Mid upper arm        0.16                              +-----------------+-------------+----------+--------------+ Dist upper arm       0.18                              +-----------------+-------------+----------+--------------+ Antecubital fossa    1.84                  lateral     +-----------------+-------------+----------+--------------+ Prox forearm         0.16                              +-----------------+-------------+----------+--------------+ Mid forearm          0.18                  lateral     +-----------------+-------------+----------+--------------+ Dist forearm                            not  visualized +-----------------+-------------+----------+--------------+  +-----------------+-------------+----------+--------+ Right Basilic    Diameter (cm)Depth (cm)Findings +-----------------+-------------+----------+--------+ Prox upper arm       0.21                        +-----------------+-------------+----------+--------+ Mid upper arm        0.32                medial  +-----------------+-------------+----------+--------+ Dist upper arm       0.37                        +-----------------+-------------+----------+--------+ Antecubital fossa    0.34                        +-----------------+-------------+----------+--------+  +-----------------+-------------+----------+--------------+ Left Cephalic    Diameter (cm)Depth (cm)   Findings    +-----------------+-------------+----------+--------------+ Prox upper arm       0.30                              +-----------------+-------------+----------+--------------+ Mid upper arm  0.22                              +-----------------+-------------+----------+--------------+ Dist upper arm                          not visualized +-----------------+-------------+----------+--------------+ Antecubital fossa    0.38                              +-----------------+-------------+----------+--------------+ Prox forearm         0.34                              +-----------------+-------------+----------+--------------+ Mid forearm          0.27                              +-----------------+-------------+----------+--------------+ Dist forearm         0.19                              +-----------------+-------------+----------+--------------+  +-----------------+-------------+----------+--------+ Left Basilic     Diameter (cm)Depth (cm)Findings +-----------------+-------------+----------+--------+ Prox upper arm       0.33                         +-----------------+-------------+----------+--------+ Mid upper arm        0.27                        +-----------------+-------------+----------+--------+ Dist upper arm       0.30                        +-----------------+-------------+----------+--------+ Antecubital fossa    0.30                        +-----------------+-------------+----------+--------+ Prox forearm         0.21                        +-----------------+-------------+----------+--------+  Summary: Right: Patent cephalic and basilic veins where visualized. Left: Patent cephalic and basilic veins where visualized.  *See table(s) above for measurements and observations.     Diagnosing physician: Lonni Gaskins MD Electronically signed by Lonni Gaskins MD on 03/20/2024 at 12:52:03 PM.      Assessment/Plan:   33 y.o. male, with history of end-stage renal disease that presents for evaluation of permanent hemodialysis access.  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  Recently had a right IJ TDC placed on 03/21/2024 and started on dialysis on 03/22/2024.  Discussed plan for access in the nondominant arm which would be his left arm.  I discussed vein mapping shows he has usable cephalic and basilic vein.  I discussed this being done as an outpatient at [facility].  Discussed taking 3 months to mature.  Risk and benefits discussed including risk of failure to mature and steal syndrome.  Will get scheduled through my office for left arm AV fistula at [facility].  All questions answered.   Lonni DOROTHA Gaskins, MD [facility]  [place] Office: [phone]

## 2024-04-17 ENCOUNTER — Ambulatory Visit: Payer: Self-pay | Admitting: Vascular Surgery

## 2024-04-17 ENCOUNTER — Encounter: Payer: Self-pay | Admitting: Vascular Surgery

## 2024-04-17 VITALS — BP 162/106 | HR 86 | Resp 20 | Ht 66.0 in | Wt 175.4 lb

## 2024-04-17 DIAGNOSIS — N186 End stage renal disease: Secondary | ICD-10-CM

## 2024-04-17 DIAGNOSIS — Z992 Dependence on renal dialysis: Secondary | ICD-10-CM | POA: Insufficient documentation

## 2024-04-18 ENCOUNTER — Other Ambulatory Visit: Payer: Self-pay

## 2024-04-18 DIAGNOSIS — N186 End stage renal disease: Secondary | ICD-10-CM

## 2024-04-23 ENCOUNTER — Other Ambulatory Visit: Payer: Self-pay

## 2024-04-23 ENCOUNTER — Encounter (HOSPITAL_COMMUNITY): Payer: Self-pay | Admitting: Vascular Surgery

## 2024-04-23 NOTE — Progress Notes (Signed)
 SDW call  Patient was given pre-op  instructions over the phone. Patient verbalized understanding of instructions provided.  He denies any SOB, fever or cough   PCP - denies Cardiologist - Dr. Vishnu Mallipeddi Nephrologist: Dr. Gaynell Clos, Dialysis T, Th, Sat at [facility] in [place]  Pulmonary:    PPM/ICD - denies Device Orders - na Rep Notified - na   Chest x-ray - na EKG -  DOS, 04/25/2024 Stress Test - ECHO - 6/28/20024 Cardiac Cath -   Sleep Study/sleep apnea/CPAP: denies  Non-diabetic  Blood Thinner Instructions: denies Aspirin Instructions:denies   ERAS Protcol - NPO   Anesthesia review: Yes. HTN, hx pericarditis, only one kidney, ESRD with dialysis T, TH, Sat  Your procedure is scheduled on Wednesday April 25, 2024  Report to [facility] Main Entrance A at  0630  A.M., then check in with the Admitting office.  Call this number if you have problems the morning of surgery:  [phone]   If you have any questions prior to your surgery date call [phone]: Open Monday-Friday 8am-4pm If you experience any cold or flu symptoms such as cough, fever, chills, shortness of breath, etc. between now and your scheduled surgery, please notify us  at the above number    Remember:  Do not eat or drink after midnight the night before your surgery  Take these medicines the morning of surgery with A SIP OF WATER:  Amlodipine   As of today, STOP taking any Aspirin (unless otherwise instructed by your surgeon) Aleve, Naproxen, Ibuprofen , Motrin , Advil , Goody's, BC's, all herbal medications, fish oil, and all vitamins.

## 2024-04-24 ENCOUNTER — Encounter (HOSPITAL_COMMUNITY): Payer: Self-pay | Admitting: Vascular Surgery

## 2024-04-24 NOTE — Anesthesia Preprocedure Evaluation (Addendum)
 Anesthesia Evaluation    Reviewed: Allergy & Precautions, H&P , Patient's Chart, lab work & pertinent test results  Airway        Dental   Pulmonary neg pulmonary ROS, former smoker          Cardiovascular Exercise Tolerance: Good hypertension, Pt. on medications negative cardio ROS      Neuro/Psych negative neurological ROS  negative psych ROS   GI/Hepatic negative GI ROS, Neg liver ROS,,,  Endo/Other  negative endocrine ROS    Renal/GU ESRF and DialysisRenal diseasenegative Renal ROS  negative genitourinary   Musculoskeletal   Abdominal   Peds  Hematology negative hematology ROS (+)   Anesthesia Other Findings   Reproductive/Obstetrics negative OB ROS                              Anesthesia Physical Anesthesia Plan  ASA: 3  Anesthesia Plan: General   Post-op Pain Management: Tylenol  PO (pre-op )* and Toradol IV (intra-op)*   Induction: Intravenous  PONV Risk Score and Plan: 3 and Ondansetron , Dexamethasone and Midazolam   Airway Management Planned: LMA  Additional Equipment:   Intra-op Plan:   Post-operative Plan: Extubation in OR  Informed Consent: I have reviewed the patients History and Physical, chart, labs and discussed the procedure including the risks, benefits and alternatives for the proposed anesthesia with the patient or authorized representative who has indicated his/her understanding and acceptance.       Plan Discussed with:   Anesthesia Plan Comments: (PAT note written 04/24/2024 by Allison Zelenak, PA-C.  )         Anesthesia Quick Evaluation

## 2024-04-24 NOTE — Progress Notes (Signed)
 Anesthesia Chart Review: SAME DAY WORK-UP  Case: [id number] Date/Time: 04/25/24 0815   Procedure: ARTERIOVENOUS (AV) FISTULA CREATION (Left)   Anesthesia type: Choice   Diagnosis: ESRD (end stage renal disease) (HCC) [N18.6]   Pre-op  diagnosis: ESRD   [facility] ROOM 12 / [facility]   Surgeons: Gretta Lonni PARAS, MD       DISCUSSION: Patient is a 33 year old male scheduled for the above procedure. S/p right internal jugular TDC 03/21/2024. Hemodialysis initiated 03/22/2024.   History includes former smoker (quit 05/17/2022), HTN, nephrolithiasis (with right kidney atrophy following staghorn calculus 2012), ESRD (HD initiated 03/22/2024; HD TTS at [facility]), chronic back pain, pericarditis (in setting of rhinovirus URI 11/11/2022 s/p course of steroids).  He is a same day work-up. Updated labs and EKG as indicated on arrival.    VS: Ht 5' 6 (1.676 m)   Wt 77.1 kg   BMI 27.44 kg/m  BP Readings from Last 3 Encounters:  04/17/24 (!) 162/106  03/21/24 (!) 148/88  09/10/23 (!) 142/106   Pulse Readings from Last 3 Encounters:  04/17/24 86  03/21/24 85  09/10/23 68     PROVIDERS: Pcp, No Mallipeddi, Vishnu, MD is cardiologist. Last visit 11/24/2022 post-hospitalization for pericarditis in setting of rhinovirus. S/p steroids since he was not a candidate for NSAID or colchicine  due to severely reduced GFR. Rachele Amour, MD is nephrologist   LABS: For day of surgery.  H/H 12.7/38.0 as of 02/28/2024.   IMAGES: CT Abd/pelvis 01/24/2024 [facility]): IMPRESSION: 1.    No CT evidence of pancreatitis.  2.    Mild left hydroureteronephrosis without obstructing stone. Consider further evaluation with CT urography to assess for underlying lesion.  3.    Mildly thick-walled urinary bladder. Correlation with urinalysis for any evidence of cystitis recommended.  4.    Hypoattenuating lesions in the left kidney as above, incompletely characterized in the absence of IV contrast.     EKG:  For day of surgery as indicated. Last tracing noted is from 11/11/2022: Sinus tachycardia at 119 bpm Ventricular premature complex Aberrant complex Nonspecific T abnormalities, diffuse leads Confirmed by Levander Houston [phone]) on 11/12/2022 11:47:11 AM   CV: Echo 11/12/2022: IMPRESSIONS   1. Left ventricular ejection fraction, by estimation, is 60 to 65%. The  left ventricle has normal function. The left ventricle has no regional  wall motion abnormalities. There is mild left ventricular hypertrophy.  Left ventricular diastolic parameters  were normal.   2. Right ventricular systolic function is normal. The right ventricular  size is normal.   3. The mitral valve is normal in structure. No evidence of mitral valve  regurgitation. No evidence of mitral stenosis.   4. The aortic valve is tricuspid. Aortic valve regurgitation is not  visualized. No aortic stenosis is present.   5. The inferior vena cava is normal in size with greater than 50%  respiratory variability, suggesting right atrial pressure of 3 mmHg.    Past Medical History:  Diagnosis Date   Chronic back pain    Chronic leg pain    ESRD (end stage renal disease) on dialysis (HCC) 03/22/2024   T, TH, Sat [facility]   Hypertension    Kidney calculi    Pericarditis 11/11/2022   in setting of rhinovirus    Past Surgical History:  Procedure Laterality Date   DIALYSIS/PERMA CATHETER INSERTION N/A 03/21/2024   Procedure: DIALYSIS/PERMA CATHETER INSERTION;  Surgeon: Pearline Norman RAMAN, MD;  Location: [facility];  Service: Cardiovascular;  Laterality: N/A;   KIDNEY STONE SURGERY      MEDICATIONS: No current facility-administered medications for this encounter.    amLODipine  (NORVASC ) 10 MG tablet   acetaminophen  (TYLENOL ) 325 MG tablet   guaiFENesin  (ROBITUSSIN) 100 MG/5ML liquid   hydrALAZINE  (APRESOLINE ) 50 MG tablet   isosorbide  mononitrate (IMDUR ) 30 MG 24 hr tablet   pantoprazole  (PROTONIX ) 40 MG  tablet   sodium bicarbonate  650 MG tablet   Isaiah Ruder, PA-C Surgical Short Stay/Anesthesiology [facility] Phone [phone] [facility] Phone [phone] 04/24/2024 1:46 PM

## 2024-04-25 ENCOUNTER — Ambulatory Visit (HOSPITAL_COMMUNITY): Payer: Self-pay | Admitting: Vascular Surgery

## 2024-04-25 ENCOUNTER — Other Ambulatory Visit: Payer: Self-pay

## 2024-04-25 ENCOUNTER — Other Ambulatory Visit (HOSPITAL_COMMUNITY): Payer: Self-pay

## 2024-04-25 ENCOUNTER — Encounter (HOSPITAL_COMMUNITY): Admission: RE | Disposition: A | Payer: Self-pay | Source: Home / Self Care | Attending: Vascular Surgery

## 2024-04-25 ENCOUNTER — Other Ambulatory Visit: Payer: Self-pay | Admitting: Vascular Surgery

## 2024-04-25 ENCOUNTER — Ambulatory Visit (HOSPITAL_COMMUNITY)
Admission: RE | Admit: 2024-04-25 | Discharge: 2024-04-25 | Disposition: A | Payer: Self-pay | Attending: Vascular Surgery | Admitting: Vascular Surgery

## 2024-04-25 DIAGNOSIS — N186 End stage renal disease: Secondary | ICD-10-CM

## 2024-04-25 DIAGNOSIS — N184 Chronic kidney disease, stage 4 (severe): Secondary | ICD-10-CM

## 2024-04-25 DIAGNOSIS — Z992 Dependence on renal dialysis: Secondary | ICD-10-CM

## 2024-04-25 HISTORY — DX: End stage renal disease: N18.6

## 2024-04-25 HISTORY — PX: AV FISTULA PLACEMENT: SHX1204

## 2024-04-25 LAB — POCT I-STAT, CHEM 8
BUN: 24 mg/dL — ABNORMAL HIGH (ref 6–20)
Calcium, Ion: 1.04 mmol/L — ABNORMAL LOW (ref 1.15–1.40)
Chloride: 98 mmol/L (ref 98–111)
Creatinine, Ser: 5.2 mg/dL — ABNORMAL HIGH (ref 0.61–1.24)
Glucose, Bld: 98 mg/dL (ref 70–99)
HCT: 32 % — ABNORMAL LOW (ref 39.0–52.0)
Hemoglobin: 10.9 g/dL — ABNORMAL LOW (ref 13.0–17.0)
Potassium: 3.9 mmol/L (ref 3.5–5.1)
Sodium: 141 mmol/L (ref 135–145)
TCO2: 30 mmol/L (ref 22–32)

## 2024-04-25 SURGERY — ARTERIOVENOUS (AV) FISTULA CREATION
Anesthesia: General | Laterality: Left

## 2024-04-25 MED ORDER — FENTANYL CITRATE (PF) 100 MCG/2ML IJ SOLN
INTRAMUSCULAR | Status: AC
  Filled 2024-04-25: qty 2

## 2024-04-25 MED ORDER — CHLORHEXIDINE GLUCONATE 4 % EX SOLN: 60.0000 mL | Freq: Once | CUTANEOUS | Status: DC

## 2024-04-25 MED ORDER — PROPOFOL 10 MG/ML IV BOLUS
INTRAVENOUS | Status: AC
  Filled 2024-04-25: qty 20

## 2024-04-25 MED ORDER — PHENYLEPHRINE HCL-NACL 20-0.9 MG/250ML-% IV SOLN
INTRAVENOUS | Status: DC | PRN
  Administered 2024-04-25: 40 ug/min via INTRAVENOUS

## 2024-04-25 MED ORDER — DEXAMETHASONE SOD PHOSPHATE PF 10 MG/ML IJ SOLN
INTRAMUSCULAR | Status: DC | PRN
  Administered 2024-04-25: 10 mg via INTRAVENOUS

## 2024-04-25 MED ORDER — LIDOCAINE 2% (20 MG/ML) 5 ML SYRINGE
INTRAMUSCULAR | Status: DC | PRN
  Administered 2024-04-25: 60 mg via INTRAVENOUS

## 2024-04-25 MED ORDER — 0.9 % SODIUM CHLORIDE (POUR BTL) OPTIME
TOPICAL | Status: DC | PRN
  Administered 2024-04-25: 1000 mL

## 2024-04-25 MED ORDER — DEXMEDETOMIDINE HCL IN NACL 80 MCG/20ML IV SOLN
INTRAVENOUS | Status: AC
  Filled 2024-04-25: qty 20

## 2024-04-25 MED ORDER — CEFAZOLIN SODIUM-DEXTROSE 2-4 GM/100ML-% IV SOLN
2.0000 g | INTRAVENOUS | Status: AC
  Administered 2024-04-25: 2 g via INTRAVENOUS
  Filled 2024-04-25: qty 100

## 2024-04-25 MED ORDER — HEPARIN 6000 UNIT IRRIGATION SOLUTION
Status: DC | PRN
  Administered 2024-04-25: 1

## 2024-04-25 MED ORDER — ACETAMINOPHEN 500 MG PO TABS
1000.0000 mg | ORAL_TABLET | Freq: Once | ORAL | Status: AC
  Administered 2024-04-25: 1000 mg via ORAL
  Filled 2024-04-25: qty 2

## 2024-04-25 MED ORDER — MIDAZOLAM HCL 2 MG/2ML IJ SOLN
INTRAMUSCULAR | Status: AC
  Filled 2024-04-25: qty 2

## 2024-04-25 MED ORDER — SUCCINYLCHOLINE CHLORIDE 200 MG/10ML IV SOSY
PREFILLED_SYRINGE | INTRAVENOUS | Status: DC | PRN
  Administered 2024-04-25: 100 mg via INTRAVENOUS

## 2024-04-25 MED ORDER — SODIUM CHLORIDE 0.9 % IV SOLN: INTRAVENOUS | Status: DC

## 2024-04-25 MED ORDER — LACTATED RINGERS IV SOLN: INTRAVENOUS | Status: DC

## 2024-04-25 MED ORDER — PROPOFOL 10 MG/ML IV BOLUS
INTRAVENOUS | Status: DC | PRN
  Administered 2024-04-25: 50 mg via INTRAVENOUS
  Administered 2024-04-25: 130 mg via INTRAVENOUS
  Administered 2024-04-25: 20 mg via INTRAVENOUS

## 2024-04-25 MED ORDER — HEPARIN 6000 UNIT IRRIGATION SOLUTION
Status: AC
  Filled 2024-04-25: qty 500

## 2024-04-25 MED ORDER — HEPARIN SODIUM (PORCINE) 1000 UNIT/ML IJ SOLN
INTRAMUSCULAR | Status: AC
  Filled 2024-04-25: qty 1

## 2024-04-25 MED ORDER — HYDROMORPHONE HCL 1 MG/ML IJ SOLN
INTRAMUSCULAR | Status: AC
  Filled 2024-04-25: qty 1

## 2024-04-25 MED ORDER — HYDROMORPHONE HCL 1 MG/ML IJ SOLN
0.2500 mg | INTRAMUSCULAR | Status: DC | PRN
  Administered 2024-04-25 (×2): 0.5 mg via INTRAVENOUS

## 2024-04-25 MED ORDER — ONDANSETRON HCL 4 MG/2ML IJ SOLN
INTRAMUSCULAR | Status: AC
  Filled 2024-04-25: qty 2

## 2024-04-25 MED ORDER — ORAL CARE MOUTH RINSE: 15.0000 mL | Freq: Once | OROMUCOSAL | Status: AC

## 2024-04-25 MED ORDER — LIDOCAINE 2% (20 MG/ML) 5 ML SYRINGE
INTRAMUSCULAR | Status: AC
  Filled 2024-04-25: qty 5

## 2024-04-25 MED ORDER — HEPARIN SODIUM (PORCINE) 1000 UNIT/ML IJ SOLN
INTRAMUSCULAR | Status: DC | PRN
  Administered 2024-04-25: 5000 [IU] via INTRAVENOUS

## 2024-04-25 MED ORDER — DEXMEDETOMIDINE HCL IN NACL 80 MCG/20ML IV SOLN
INTRAVENOUS | Status: DC | PRN
  Administered 2024-04-25: 4 ug via INTRAVENOUS

## 2024-04-25 MED ORDER — CHLORHEXIDINE GLUCONATE 0.12 % MT SOLN
15.0000 mL | Freq: Once | OROMUCOSAL | Status: AC
  Administered 2024-04-25: 15 mL via OROMUCOSAL
  Filled 2024-04-25: qty 15

## 2024-04-25 MED ORDER — EPHEDRINE SULFATE-NACL 50-0.9 MG/10ML-% IV SOSY
PREFILLED_SYRINGE | INTRAVENOUS | Status: DC | PRN
  Administered 2024-04-25 (×3): 5 mg via INTRAVENOUS

## 2024-04-25 MED ORDER — MIDAZOLAM HCL (PF) 2 MG/2ML IJ SOLN
INTRAMUSCULAR | Status: DC | PRN
  Administered 2024-04-25: 2 mg via INTRAVENOUS

## 2024-04-25 MED ORDER — ONDANSETRON HCL 4 MG/2ML IJ SOLN
INTRAMUSCULAR | Status: DC | PRN
  Administered 2024-04-25: 4 mg via INTRAVENOUS

## 2024-04-25 MED ORDER — FENTANYL CITRATE (PF) 250 MCG/5ML IJ SOLN
INTRAMUSCULAR | Status: DC | PRN
  Administered 2024-04-25 (×2): 25 ug via INTRAVENOUS
  Administered 2024-04-25: 50 ug via INTRAVENOUS
  Administered 2024-04-25 (×2): 25 ug via INTRAVENOUS

## 2024-04-25 MED ORDER — OXYCODONE HCL 5 MG PO TABS
5.0000 mg | ORAL_TABLET | ORAL | 0 refills | Status: DC | PRN
  Filled 2024-04-25: qty 6, 1d supply, fill #0

## 2024-04-25 SURGICAL SUPPLY — 28 items
ARMBAND PINK RESTRICT EXTREMIT (MISCELLANEOUS) ×2 IMPLANT
BAG COUNTER SPONGE SURGICOUNT (BAG) ×1 IMPLANT
BLADE CLIPPER SURG (BLADE) ×1 IMPLANT
CANISTER SUCTION 3000ML PPV (SUCTIONS) ×1 IMPLANT
CLIP TI MEDIUM 6 (CLIP) ×1 IMPLANT
CLIP TI WIDE RED SMALL 6 (CLIP) ×1 IMPLANT
COVER PROBE W GEL 5X96 (DRAPES) ×1 IMPLANT
DERMABOND ADVANCED .7 DNX12 (GAUZE/BANDAGES/DRESSINGS) ×1 IMPLANT
ELECTRODE REM PT RTRN 9FT ADLT (ELECTROSURGICAL) ×1 IMPLANT
GLOVE BIO SURGEON STRL SZ7.5 (GLOVE) ×1 IMPLANT
GLOVE BIOGEL PI IND STRL 8 (GLOVE) ×1 IMPLANT
GOWN STRL REUS W/ TWL LRG LVL3 (GOWN DISPOSABLE) ×2 IMPLANT
GOWN STRL REUS W/ TWL XL LVL3 (GOWN DISPOSABLE) ×2 IMPLANT
KIT BASIN OR (CUSTOM PROCEDURE TRAY) ×1 IMPLANT
KIT TURNOVER KIT B (KITS) ×1 IMPLANT
LOOP VESSEL MINI RED (MISCELLANEOUS) IMPLANT
PACK CV ACCESS (CUSTOM PROCEDURE TRAY) ×1 IMPLANT
PAD ARMBOARD POSITIONER FOAM (MISCELLANEOUS) ×2 IMPLANT
SLING ARM FOAM STRAP LRG (SOFTGOODS) IMPLANT
SOLN 0.9% NACL POUR BTL 1000ML (IV SOLUTION) ×1 IMPLANT
SOLN STERILE WATER BTL 1000 ML (IV SOLUTION) ×1 IMPLANT
SPIKE FLUID TRANSFER (MISCELLANEOUS) ×1 IMPLANT
SUT MNCRL AB 4-0 PS2 18 (SUTURE) ×1 IMPLANT
SUT PROLENE 6 0 BV (SUTURE) ×1 IMPLANT
SUT PROLENE 7 0 BV 1 (SUTURE) IMPLANT
SUT VIC AB 3-0 SH 27X BRD (SUTURE) ×1 IMPLANT
TOWEL GREEN STERILE (TOWEL DISPOSABLE) ×1 IMPLANT
UNDERPAD 30X36 HEAVY ABSORB (UNDERPADS AND DIAPERS) ×1 IMPLANT

## 2024-04-25 NOTE — Anesthesia Postprocedure Evaluation (Signed)
 Anesthesia Post Note  Patient: BRYSUN ESCHMANN  Procedure(s) Performed: ARTERIOVENOUS (AV) FISTULA CREATION (Left)     Patient location during evaluation: PACU Anesthesia Type: General Level of consciousness: awake and alert Pain management: pain level controlled Vital Signs Assessment: post-procedure vital signs reviewed and stable Respiratory status: spontaneous breathing, nonlabored ventilation and respiratory function stable Cardiovascular status: blood pressure returned to baseline and stable Postop Assessment: no apparent nausea or vomiting Anesthetic complications: no   No notable events documented.  Last Vitals:  Vitals:   04/25/24 1145 04/25/24 1151  BP: 124/74 122/81  Pulse: 89 94  Resp: 16 16  Temp:  36.6 C  SpO2: 97% 96%    Last Pain:  Vitals:   04/25/24 1151  TempSrc:   PainSc: 6                  Jiyan Walkowski,W. EDMOND

## 2024-04-25 NOTE — Progress Notes (Signed)
 Crisis counseling resources added to AVS.

## 2024-04-25 NOTE — Op Note (Signed)
 OPERATIVE NOTE   DATE: April 25, 2024  PROCEDURE: left first stage basilic vein transposition (brachiobasilic arteriovenous fistula) placement  PRE-OPERATIVE DIAGNOSIS: ESRD  POST-OPERATIVE DIAGNOSIS: same  SURGEON: Lonni DOROTHA Gaskins, MD  ASSISTANT(S): Ahmed Holster, PA  ANESTHESIA: general  ESTIMATED BLOOD LOSS: <50 mL  FINDING(S): 1.  Basilic vein: 3 mm, acceptable 2.  Brachial artery: 4 mm, atherosclerotic disease evident 3.  Venous outflow: palpable thrill  4.  Radial flow: palpable radial pulse  SPECIMEN(S):  none  INDICATIONS:   Manuel Riggs is a 33 y.o. male who presents with ESRD and the need for permanent hemodialysis access.  The patient is scheduled for left arm AVF.  The patient is aware the risks include but are not limited to: bleeding, infection, steal syndrome, nerve damage, ischemic monomelic neuropathy, failure to mature, and need for additional procedures.  The patient is aware of the risks of the procedure and elects to proceed forward.  Assistant was needed given the complexity of the case and also to sew the anastomosis.   DESCRIPTION: After full informed written consent was obtained from the patient, the patient was brought back to the operating room and placed supine upon the operating table.  Prior to induction, the patient received IV antibiotics.   After obtaining adequate anesthesia, the patient was then prepped and draped in the standard fashion for a left arm access procedure.  I initially looked at the cephalic vein but this appeared to be very small in the upper arm.  I turned my attention first to identifying the patient's basilic vein and brachial artery.    Using SonoSite guidance, the location of these vessels were marked out on the skin.  I made a transverse incision at the level of the antecubitum and dissected through the subcutaneous tissue and fascia to gain exposure of the brachial artery.  This was noted to be 4 mm in diameter  externally.  This was dissected out proximally and distally and controlled with vessel loops .  I then dissected out the basilic vein.  This was noted to be 3 mm in diameter externally.  The distal segment of the vein was ligated with a  2-0 silk, and the vein was transected.  The proximal segment was interrogated with serial dilators.  The vein accepted up to a 4 mm dilator without any difficulty.  I then instilled the heparinized saline into the vein and clamped it.  I gave the patient 5000 units of IV heparin .  At this point, I reset my exposure of the brachial artery and placed the artery under tension proximally and distally.  I made an arteriotomy with a #11 blade, and then I extended the arteriotomy with a Potts scissor.  I injected heparinized saline proximal and distal to this arteriotomy.  The vein was then sewn to the artery in an end-to-side configuration with a running stitch of 6-0 Prolene with the help of my assistant.  I was not happy with the thrill.  I elected to takedown the anastomosis.  I then spatulated the vein for a bigger venotomy.  I performed a new end to side anastomosis with 6-0 Prolene with the help of my assistant.  Prior to completing this anastomosis, I allowed the vein and artery to backbleed.  There was no evidence of clot from any vessels.  I completed the anastomosis in the usual fashion and then released all vessel loops and clamps.    There was a palpable thrill in the venous  outflow, and there was a palpable radial pulse.  At this point, I irrigated out the surgical wound.  There was no further active bleeding.  The subcutaneous tissue was reapproximated with a running stitch of 3-0 Vicryl.  The skin was then reapproximated with a running subcuticular stitch of 4-0 Monocryl.  The skin was then cleaned, dried, and reinforced with Dermabond.  The patient tolerated this procedure well.   COMPLICATIONS: None  CONDITION: Stable  Lonni DOROTHA Gaskins MD [facility] Office: [phone]  [name]   04/25/2024, 10:14 AM

## 2024-04-25 NOTE — Anesthesia Procedure Notes (Signed)
 Procedure Name: Intubation Date/Time: 04/25/2024 8:32 AM  Performed by: Atanacio Arland HERO, CRNAPre-anesthesia Checklist: Patient identified, Emergency Drugs available, Suction available and Patient being monitored Patient Re-evaluated:Patient Re-evaluated prior to induction Oxygen Delivery Method: Circle System Utilized Preoxygenation: Pre-oxygenation with 100% oxygen Induction Type: IV induction, Rapid sequence and Cricoid Pressure applied Laryngoscope Size: Mac and 4 Grade View: Grade I Tube type: Oral Tube size: 7.5 mm Number of attempts: 1 Airway Equipment and Method: Stylet Placement Confirmation: ETT inserted through vocal cords under direct vision, positive ETCO2 and breath sounds checked- equal and bilateral Secured at: 23 cm Tube secured with: Tape Dental Injury: Teeth and Oropharynx as per pre-operative assessment

## 2024-04-25 NOTE — Progress Notes (Signed)
 Significant other Mitzie Heard is in waiting area if unable to reach her by phone go to waiting area.

## 2024-04-25 NOTE — Transfer of Care (Signed)
 Immediate Anesthesia Transfer of Care Note  Patient: Manuel Riggs  Procedure(s) Performed: ARTERIOVENOUS (AV) FISTULA CREATION (Left)  Patient Location: PACU  Anesthesia Type:General  Level of Consciousness: drowsy  Airway & Oxygen Therapy: Patient Spontanous Breathing and Patient connected to nasal cannula oxygen  Post-op Assessment: Report given to RN and Post -op Vital signs reviewed and stable  Post vital signs: Reviewed and stable  Last Vitals:  Vitals Value Taken Time  BP 124/85 04/25/24 10:35  Temp 98.7   Pulse 99 04/25/24 10:36  Resp 11 04/25/24 10:36  SpO2 97 % 04/25/24 10:36  Vitals shown include unfiled device data.  Last Pain:  Vitals:   04/25/24 0724  TempSrc:   PainSc: 0-No pain         Complications: No notable events documented.

## 2024-04-25 NOTE — Discharge Instructions (Addendum)
 Call to schedule an appt for therapy and medication management.     [facility]  Discharge Instructions  AV Fistula or Graft Surgery for Dialysis Access  Please refer to the following instructions for your post-procedure care. Your surgeon or physician assistant will discuss any changes with you.  Activity  You may drive the day following your surgery, if you are comfortable and no longer taking prescription pain medication. Resume full activity as the soreness in your incision resolves.  Bathing/Showering  You may shower after you go home. Keep your incision dry for 48 hours. Do not soak in a bathtub, hot tub, or swim until the incision heals completely. You may not shower if you have a hemodialysis catheter.  Incision Care  Clean your incision with mild soap and water after 48 hours. Pat the area dry with a clean towel. You do not need a bandage unless otherwise instructed. Do not apply any ointments or creams to your incision. You may have skin glue on your incision. Do not peel it off. It will come off on its own in about one week. Your arm may swell a bit after surgery. To reduce swelling use pillows to elevate your arm so it is above your heart. Your doctor will tell you if you need to lightly wrap your arm with an ACE bandage.  Diet  Resume your normal diet. There are not special food restrictions following this procedure. In order to heal from your surgery, it is CRITICAL to get adequate nutrition. Your body requires vitamins, minerals, and protein. Vegetables are the best source of vitamins and minerals. Vegetables also provide the perfect balance of protein. Processed food has little nutritional value, so try to avoid this.  Medications  Resume taking all of your medications. If your incision is causing pain, you may take over-the counter pain relievers such as acetaminophen  (Tylenol ). If you were prescribed a stronger pain medication, please  be aware these medications can cause nausea and constipation. Prevent nausea by taking the medication with a snack or meal. Avoid constipation by drinking plenty of fluids and eating foods with high amount of fiber, such as fruits, vegetables, and grains.  Do not take Tylenol  if you are taking prescription pain medications.  Follow up Your surgeon may want to see you in the office following your access surgery. If so, this will be arranged at the time of your surgery.  Please call us  immediately for any of the following conditions:  Increased pain, redness, drainage (pus) from your incision site Fever of 101 degrees or higher Severe or worsening pain at your incision site Hand pain or numbness.  Reduce your risk of vascular disease:  Stop smoking. If you would like help, call [facility] at 1-800-QUIT-NOW ((979) 657-5016) or [place] at [phone]  Manage your cholesterol Maintain a desired weight Control your diabetes Keep your blood pressure down  Dialysis  It will take several weeks to several months for your new dialysis access to be ready for use. Your surgeon will determine when it is okay to use it. Your nephrologist will continue to direct your dialysis. You can continue to use your Permcath until your new access is ready for use.   04/25/2024 Manuel Riggs [id number] 01/29/1991  Surgeon(s): Gretta Lonni PARAS, MD  Procedure(s): ARTERIOVENOUS (AV) FISTULA CREATION   May stick graft immediately   May stick graft on designated area only:   x Do not stick fistula for 12 weeks  If you have any questions, please call the office at [phone].

## 2024-04-25 NOTE — H&P (Signed)
 History and Physical Interval Note:  04/25/2024 8:05 AM  Manuel Riggs  has presented today for surgery, with the diagnosis of ESRD.  The various methods of treatment have been discussed with the patient and family. After consideration of risks, benefits and other options for treatment, the patient has consented to  Procedure(s): ARTERIOVENOUS (AV) FISTULA CREATION (Left) as a surgical intervention.  The patient's history has been reviewed, patient examined, no change in status, stable for surgery.  I have reviewed the patient's chart and labs.  Questions were answered to the patient's satisfaction.    Left arm AVF versus AVG  Manuel Riggs     Patient name: Manuel Riggs  MRN: [id number]        DOB: 1991/03/20          Sex: male   REASON FOR CONSULT: Permanent access creation   HPI: BURDELL Riggs is a 33 y.o. male, with history of end-stage renal disease that presents for evaluation of permanent hemodialysis access.  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  Recently had a right IJ TDC placed on 03/21/2024 and started on dialysis on 03/22/2024.   Patient states he is right-handed.  No prior access in the past.  Only chest wall implant is the right IJ catheter.  States the catheter is working well.       Past Medical History:  Diagnosis Date   Chronic back pain     Chronic leg pain     Kidney calculi                 Past Surgical History:  Procedure Laterality Date   DIALYSIS/PERMA CATHETER INSERTION N/A 03/21/2024    Procedure: DIALYSIS/PERMA CATHETER INSERTION;  Surgeon: Pearline Norman RAMAN, MD;  Location: [facility];  Service: Cardiovascular;  Laterality: N/A;   KIDNEY STONE SURGERY                   Family History  Problem Relation Age of Onset   Hypertension Mother            SOCIAL HISTORY: Social History         Socioeconomic History   Marital status: Single      Spouse name: Not on file   Number of  children: Not on file   Years of education: Not on file   Highest education level: Not on file  Occupational History   Not on file  Tobacco Use   Smoking status: Every Day      Types: Cigarettes   Smokeless tobacco: Not on file  Substance and Sexual Activity   Alcohol use: No   Drug use: Yes      Types: Marijuana   Sexual activity: Not Currently  Other Topics Concern   Not on file  Social History Narrative   Not on file    Social Drivers of Health        Financial Resource Strain: Low Risk (01/24/2024)    Received from [facility]    Overall Financial Resource Strain (CARDIA)     How hard is it for you to pay for the very basics like food, housing, medical care, and heating?: Not hard at all  Food Insecurity: No Food Insecurity (01/24/2024)    [facility]    Hunger Vital Sign     Within the past 12 months, you worried that your food would run out  before you got the money to buy more.: Never true     Within the past 12 months, the food you bought just didn't last and you didn't have money to get more.: Never true  Transportation Needs: No Transportation Needs (01/24/2024)    Received from [facility]     Lack of Transportation (Medical): No     Lack of Transportation (Non-Medical): No  Physical Activity: Not on file  Stress: Not on file  Social Connections: Not on file  Intimate Partner Violence: Not At Risk (01/24/2024)    Received from [facility]    Humiliation, Afraid, Rape, and Kick questionnaire     Within the last year, have you been afraid of your partner or ex-partner?: No     Within the last year, have you been humiliated or emotionally abused in other ways by your partner or ex-partner?: No     Within the last year, have you been kicked, hit, slapped, or otherwise physically hurt by your partner or ex-partner?: No     Within the last year, have you been raped or forced to have any kind of sexual activity by your  partner or ex-partner?: No      Allergies       Allergies  Allergen Reactions   Molds & Smuts Anaphylaxis      Swollen tongue, hives, itching   Aspirin        Break out              Current Outpatient Medications  Medication Sig Dispense Refill   acetaminophen  (TYLENOL ) 325 MG tablet Take 2 tablets (650 mg total) by mouth every 6 (six) hours as needed for mild pain (or Fever >/= 101). 30 tablet 0   amLODipine  (NORVASC ) 10 MG tablet Take 1 tablet (10 mg total) by mouth daily. 30 tablet 3   guaiFENesin  (ROBITUSSIN) 100 MG/5ML liquid Take 5 mLs by mouth every 4 (four) hours as needed for cough or to loosen phlegm. 120 mL 0   hydrALAZINE  (APRESOLINE ) 50 MG tablet Take 1 tablet (50 mg total) by mouth 3 (three) times daily. 90 tablet 3   isosorbide  mononitrate (IMDUR ) 30 MG 24 hr tablet Take 1 tablet (30 mg total) by mouth daily. 30 tablet 3   pantoprazole  (PROTONIX ) 40 MG tablet Take 1 tablet (40 mg total) by mouth daily. 30 tablet 1   sodium bicarbonate  650 MG tablet Take 1 tablet (650 mg total) by mouth 2 (two) times daily. 60 tablet 5      No current facility-administered medications for this visit.        REVIEW OF SYSTEMS:  [X]  denotes positive finding, [ ]  denotes negative finding Cardiac   Comments:  Chest pain or chest pressure:      Shortness of breath upon exertion:      Short of breath when lying flat:      Irregular heart rhythm:             Vascular      Pain in calf, thigh, or hip brought on by ambulation:      Pain in feet at night that wakes you up from your sleep:       Blood clot in your veins:      Leg swelling:              Pulmonary      Oxygen at home:      Productive cough:  Wheezing:              Neurologic      Sudden weakness in arms or legs:       Sudden numbness in arms or legs:       Sudden onset of difficulty speaking or slurred speech:      Temporary loss of vision in one eye:       Problems with dizziness:               Gastrointestinal      Blood in stool:       Vomited blood:              Genitourinary      Burning when urinating:       Blood in urine:             Psychiatric      Major depression:              Hematologic      Bleeding problems:      Problems with blood clotting too easily:             Skin      Rashes or ulcers:             Constitutional      Fever or chills:          PHYSICAL EXAM: There were no vitals filed for this visit.   GENERAL: The patient is a well-nourished male, in no acute distress. The vital signs are documented above. CARDIAC: There is a regular rate and rhythm.  VASCULAR:  Bilateral radial pulses palpable Right IJ TDC PULMONARY: No respiratory distress. ABDOMEN: Soft and non-tender. MUSCULOSKELETAL: There are no major deformities or cyanosis. NEUROLOGIC: No focal weakness or paresthesias are detected. PSYCHIATRIC: The patient has a normal affect.   DATA:    UPPER EXTREMITY VEIN MAPPING  Patient Name:  RUMEAL CULLIPHER  Date of Exam:   03/20/2024 Medical Rec #: [id number]      Accession #:    [id number] Date of Birth: 19-Jan-1991      Patient Gender: M Patient Age:   33 years Exam Location:  [address] Procedure:      VAS US  UPPER EXT VEIN MAPPING (PRE-OP  AVF) Referring Phys: NORMAN SERVE   --------------------------------------------------------------------------- -----   Indications: Pre-access.  Performing Technologist: King Pierre RVT    Examination Guidelines: A complete evaluation includes B-mode imaging, spectral Doppler, color Doppler, and power Doppler as needed of all accessible portions of each vessel. Bilateral testing is considered an integral part of a complete examination. Limited examinations for reoccurring indications may be performed as noted.  +-----------------+-------------+----------+--------------+ Right Cephalic   Diameter (cm)Depth (cm)   Findings     +-----------------+-------------+----------+--------------+ Prox upper arm       0.17                              +-----------------+-------------+----------+--------------+ Mid upper arm        0.16                              +-----------------+-------------+----------+--------------+ Dist upper arm       0.18                              +-----------------+-------------+----------+--------------+ Antecubital fossa  1.84                  lateral     +-----------------+-------------+----------+--------------+ Prox forearm         0.16                              +-----------------+-------------+----------+--------------+ Mid forearm          0.18                  lateral     +-----------------+-------------+----------+--------------+ Dist forearm                            not visualized +-----------------+-------------+----------+--------------+  +-----------------+-------------+----------+--------+ Right Basilic    Diameter (cm)Depth (cm)Findings +-----------------+-------------+----------+--------+ Prox upper arm       0.21                        +-----------------+-------------+----------+--------+ Mid upper arm        0.32                medial  +-----------------+-------------+----------+--------+ Dist upper arm       0.37                        +-----------------+-------------+----------+--------+ Antecubital fossa    0.34                        +-----------------+-------------+----------+--------+  +-----------------+-------------+----------+--------------+ Left Cephalic    Diameter (cm)Depth (cm)   Findings    +-----------------+-------------+----------+--------------+ Prox upper arm       0.30                              +-----------------+-------------+----------+--------------+ Mid upper arm        0.22                               +-----------------+-------------+----------+--------------+ Dist upper arm                          not visualized +-----------------+-------------+----------+--------------+ Antecubital fossa    0.38                              +-----------------+-------------+----------+--------------+ Prox forearm         0.34                              +-----------------+-------------+----------+--------------+ Mid forearm          0.27                              +-----------------+-------------+----------+--------------+ Dist forearm         0.19                              +-----------------+-------------+----------+--------------+  +-----------------+-------------+----------+--------+ Left Basilic     Diameter (cm)Depth (cm)Findings +-----------------+-------------+----------+--------+ Prox upper arm       0.33                        +-----------------+-------------+----------+--------+  Mid upper arm        0.27                        +-----------------+-------------+----------+--------+ Dist upper arm       0.30                        +-----------------+-------------+----------+--------+ Antecubital fossa    0.30                        +-----------------+-------------+----------+--------+ Prox forearm         0.21                        +-----------------+-------------+----------+--------+  Summary: Right: Patent cephalic and basilic veins where visualized. Left: Patent cephalic and basilic veins where visualized.  *See table(s) above for measurements and observations.     Diagnosing physician: Manuel Gaskins MD Electronically signed by Manuel Gaskins MD on 03/20/2024 at 12:52:03 PM.        Assessment/Plan:    33 y.o. male, with history of end-stage renal disease that presents for evaluation of permanent hemodialysis access.  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   Recently had a right IJ TDC placed on 03/21/2024 and started on dialysis on 03/22/2024.   Discussed plan for access in the nondominant arm which would be his left arm.  I discussed vein mapping shows he has usable cephalic and basilic vein.  I discussed this being done as an outpatient at [facility].  Discussed taking 3 months to mature.  Risk and benefits discussed including risk of failure to mature and steal syndrome.  Will get scheduled through my office for left arm AV fistula at [facility].  All questions answered.     Manuel DOROTHA Gaskins, MD [facility] [place] Office: [phone]

## 2024-04-26 ENCOUNTER — Encounter (HOSPITAL_COMMUNITY): Payer: Self-pay | Admitting: Vascular Surgery

## 2024-04-28 ENCOUNTER — Emergency Department (HOSPITAL_COMMUNITY): Admission: EM | Admit: 2024-04-28 | Discharge: 2024-04-28 | Disposition: A | Discharge status: Home / Self Care

## 2024-04-28 ENCOUNTER — Other Ambulatory Visit: Payer: Self-pay

## 2024-04-28 ENCOUNTER — Emergency Department (HOSPITAL_COMMUNITY)

## 2024-04-28 ENCOUNTER — Emergency Department (EMERGENCY_DEPARTMENT_HOSPITAL)

## 2024-04-28 ENCOUNTER — Encounter (HOSPITAL_COMMUNITY): Payer: Self-pay

## 2024-04-28 DIAGNOSIS — I770 Arteriovenous fistula, acquired: Secondary | ICD-10-CM | POA: Insufficient documentation

## 2024-04-28 DIAGNOSIS — N186 End stage renal disease: Secondary | ICD-10-CM | POA: Insufficient documentation

## 2024-04-28 DIAGNOSIS — R202 Paresthesia of skin: Secondary | ICD-10-CM

## 2024-04-28 DIAGNOSIS — R200 Anesthesia of skin: Secondary | ICD-10-CM | POA: Insufficient documentation

## 2024-04-28 LAB — CBC WITH DIFFERENTIAL/PLATELET
Abs Immature Granulocytes: 0.02 K/uL (ref 0.00–0.07)
Basophils Absolute: 0.1 K/uL (ref 0.0–0.1)
Basophils Relative: 1 %
Eosinophils Absolute: 0.3 K/uL (ref 0.0–0.5)
Eosinophils Relative: 5 %
HCT: 32.2 % — ABNORMAL LOW (ref 39.0–52.0)
Hemoglobin: 10.5 g/dL — ABNORMAL LOW (ref 13.0–17.0)
Immature Granulocytes: 0 %
Lymphocytes Relative: 22 %
Lymphs Abs: 1.5 K/uL (ref 0.7–4.0)
MCH: 29.4 pg (ref 26.0–34.0)
MCHC: 32.6 g/dL (ref 30.0–36.0)
MCV: 90.2 fL (ref 80.0–100.0)
Monocytes Absolute: 0.4 K/uL (ref 0.1–1.0)
Monocytes Relative: 5 %
Neutro Abs: 4.5 K/uL (ref 1.7–7.7)
Neutrophils Relative %: 67 %
Platelets: 270 K/uL (ref 150–400)
RBC: 3.57 MIL/uL — ABNORMAL LOW (ref 4.22–5.81)
RDW: 13.2 % (ref 11.5–15.5)
WBC: 6.8 K/uL (ref 4.0–10.5)
nRBC: 0 % (ref 0.0–0.2)

## 2024-04-28 LAB — COMPREHENSIVE METABOLIC PANEL WITH GFR
ALT: 9 U/L (ref 0–44)
AST: 21 U/L (ref 15–41)
Albumin: 3.4 g/dL — ABNORMAL LOW (ref 3.5–5.0)
Alkaline Phosphatase: 101 U/L (ref 38–126)
Anion gap: 9 (ref 5–15)
BUN: 47 mg/dL — ABNORMAL HIGH (ref 6–20)
CO2: 24 mmol/L (ref 22–32)
Calcium: 8.5 mg/dL — ABNORMAL LOW (ref 8.9–10.3)
Chloride: 107 mmol/L (ref 98–111)
Creatinine, Ser: 7.25 mg/dL — ABNORMAL HIGH (ref 0.61–1.24)
GFR, Estimated: 9 mL/min — ABNORMAL LOW (ref >60)
Glucose, Bld: 86 mg/dL (ref 70–99)
Potassium: 4.9 mmol/L (ref 3.5–5.1)
Sodium: 140 mmol/L (ref 135–145)
Total Bilirubin: <0.2 mg/dL (ref 0.0–1.2)
Total Protein: 6.5 g/dL (ref 6.5–8.1)

## 2024-04-28 LAB — I-STAT CG4 LACTIC ACID, ED: Lactic Acid, Venous: 0.4 mmol/L — ABNORMAL LOW (ref 0.5–1.9)

## 2024-04-28 MED ORDER — HYDROCODONE-ACETAMINOPHEN 5-325 MG PO TABS
1.0000 | ORAL_TABLET | Freq: Once | ORAL | Status: AC
  Administered 2024-04-28: 1 via ORAL
  Filled 2024-04-28: qty 1

## 2024-04-28 MED ORDER — ACETAMINOPHEN-CODEINE 300-30 MG PO TABS: 1.0000 | ORAL_TABLET | Freq: Four times a day (QID) | ORAL | 0 refills | Status: AC | PRN

## 2024-04-28 MED ORDER — ACETAMINOPHEN-CODEINE 300-30 MG PO TABS
1.0000 | ORAL_TABLET | Freq: Once | ORAL | Status: AC
  Administered 2024-04-28: 1 via ORAL
  Filled 2024-04-28: qty 1

## 2024-04-28 NOTE — ED Notes (Signed)
Transported to Vascular Lab.  

## 2024-04-28 NOTE — Discharge Instructions (Signed)
 Your laboratories alts are within normal limits today.  We did not feel that your graft is infected at this time.  There is no acute finding on today's workup.  You were given a short course of pain medication to help with your symptoms, please take this as prescribed for severe pain.  Follow-up with your vascular surgeon for further management.

## 2024-04-28 NOTE — ED Notes (Signed)
 Returned from vascular lab

## 2024-04-28 NOTE — Progress Notes (Signed)
 Status post left basilic vein fistula by Dr. Gretta on 04/25/2024.  He comes into the emergency department with complaints of numbness along the medial aspect of his left forearm.  He denies any pain or tingling or numbness in his hand.  On exam there is a good thrill within his fistula.  He has a palpable radial pulse.  He has excellent grip strength in his hand.  I suspect that this is just a peripheral nerve injury that should improve with time although I told him that it may not.  He is stable from a vascular perspective and can keep his routine follow-up  Manuel Riggs

## 2024-04-28 NOTE — ED Triage Notes (Signed)
 Pt had surgery Wednesday to get AV fistula placed in his left upper arm. Since yesterday pt has had numbness in hie left posterior forearm from just above his AC down to his wrist. +bruit and thrill, warm to touch, no redness noted to incision site.

## 2024-04-28 NOTE — Progress Notes (Signed)
 LUE venous duplex has been completed.  Preliminary results given to Johana Soto, PA-C.   Results can be found under chart review under CV PROC. 04/28/2024 2:37 PM Prisma Decarlo RVT, RDMS

## 2024-04-28 NOTE — ED Provider Notes (Signed)
 [place] [facility] AT [facility] Provider Note   CSN: [id number] Arrival date & time: 04/28/24  1032     Patient presents with: Numbness and Post-op Problem   Manuel Riggs is a 33 y.o. male.   33 y.o male with a PMH of ESRD Tuesdaywith an AV fistula presents to the ED with a chief complaint of left forearm numbness x 12/10.  Patient reports pain since the day of the surgery, however began to feel some numbness yesterday. Numbness from his AC down to his left wrist.  Exacerbated with any type of movement and palpation.  He has been taking Norco every 6 hours for pain without much improvement.  He is on dialysis Tuesday, Thursday, Saturday but reports he has not gone since Tuesday due to being too doped up .  He reports he feels like his arm is asleep .  No alleviating factors. NO fever, no drainage, no other complaints reported.   The history is provided by the patient.       Prior to Admission medications  Medication Sig Start Date End Date Taking? Authorizing Provider  acetaminophen  (TYLENOL ) 325 MG tablet Take 2 tablets (650 mg total) by mouth every 6 (six) hours as needed for mild pain (or Fever >/= 101). 11/11/22  Yes Pearlean Manus, MD  acetaminophen -codeine  (TYLENOL  #3) 300-30 MG tablet Take 1 tablet by mouth every 6 (six) hours as needed for up to 3 days for moderate pain (pain score 4-6). 04/28/24 05/01/24 Yes Mouna Yager, PA-C  amLODipine  (NORVASC ) 10 MG tablet Take 1 tablet (10 mg total) by mouth daily. 09/09/23  Yes Dean Clarity, MD    Allergies: Molds & smuts and Aspirin    Review of Systems  Constitutional:  Negative for chills and fever.  Respiratory:  Negative for shortness of breath.   Cardiovascular:  Negative for chest pain.  Gastrointestinal:  Negative for abdominal pain, nausea and vomiting.  Genitourinary:  Negative for flank pain.  Musculoskeletal:  Positive for myalgias. Negative for back pain.  Skin:  Positive for wound.   Neurological:  Negative for dizziness and headaches.  All other systems reviewed and are negative.   Updated Vital Signs BP 121/82 (BP Location: Right Arm)   Pulse 79   Temp 98.2 F (36.8 C) (Oral)   Resp 20   Ht 5' 6 (1.676 m)   Wt 78.7 kg   SpO2 100%   BMI 28.00 kg/m   Physical Exam Vitals and nursing note reviewed.  Constitutional:      Appearance: Normal appearance.  HENT:     Head: Normocephalic and atraumatic.     Nose: Nose normal.     Mouth/Throat:     Mouth: Mucous membranes are moist.  Eyes:     General: No scleral icterus. Cardiovascular:     Rate and Rhythm: Normal rate.     Pulses:          Radial pulses are 2+ on the right side and 2+ on the left side.  Pulmonary:     Effort: Pulmonary effort is normal.  Abdominal:     General: Abdomen is flat.  Musculoskeletal:     Cervical back: Normal range of motion and neck supple.  Skin:    General: Skin is warm and dry.      Neurological:     Mental Status: He is alert and oriented to person, place, and time.     (all labs ordered are listed, but only abnormal results  are displayed) Labs Reviewed  CBC WITH DIFFERENTIAL/PLATELET - Abnormal; Notable for the following components:      Result Value   RBC 3.57 (*)    Hemoglobin 10.5 (*)    HCT 32.2 (*)    All other components within normal limits  COMPREHENSIVE METABOLIC PANEL WITH GFR - Abnormal; Notable for the following components:   BUN 47 (*)    Creatinine, Ser 7.25 (*)    Calcium 8.5 (*)    Albumin  3.4 (*)    GFR, Estimated 9 (*)    All other components within normal limits  I-STAT CG4 LACTIC ACID, ED - Abnormal; Notable for the following components:   Lactic Acid, Venous 0.4 (*)    All other components within normal limits  I-STAT CG4 LACTIC ACID, ED    EKG: None  Radiology: UE Venous Duplex ([facility] and [facility] ONLY) Result Date: 04/28/2024 UPPER VENOUS STUDY  Patient Name:  Manuel Riggs  Date of Exam:   04/28/2024 Medical Rec #: [id number]       Accession #:    [id number] Date of Birth: 02/28/1991      Patient Gender: M Patient Age:   70 years Exam Location:  [facility] Procedure:      VAS US  UPPER EXTREMITY VENOUS DUPLEX Referring Phys: MERL Ender Rorke --------------------------------------------------------------------------------  Indications: numbness Other Indications: Brachiobasilic AVF placement on 04/25/2024. Limitations: Patient pain intolerance (recent LUE surgery). Comparison Study: No previous exams Performing Technologist: Jody Hill RVT, RDMS  Examination Guidelines: A complete evaluation includes B-mode imaging, spectral Doppler, color Doppler, and power Doppler as needed of all accessible portions of each vessel. Bilateral testing is considered an integral part of a complete examination. Limited examinations for reoccurring indications may be performed as noted.  Right Findings: Right subclavian vein not imaged due to [facility] placement/bandage  Left Findings: +----------+------------+---------+-----------+----------+--------------------+ LEFT      CompressiblePhasicitySpontaneousProperties      Summary        +----------+------------+---------+-----------+----------+--------------------+ IJV           Full       Yes       Yes                                   +----------+------------+---------+-----------+----------+--------------------+ Subclavian               Yes       Yes                   patent by                                                              color/doppler     +----------+------------+---------+-----------+----------+--------------------+ Axillary      Full       Yes       Yes                                   +----------+------------+---------+-----------+----------+--------------------+ Brachial      Full       Yes       Yes                                   +----------+------------+---------+-----------+----------+--------------------+  Radial        Full                                                        +----------+------------+---------+-----------+----------+--------------------+ Ulnar         Full                                                       +----------+------------+---------+-----------+----------+--------------------+ Cephalic      Full                                                       +----------+------------+---------+-----------+----------+--------------------+ Basilic       Full                                                       +----------+------------+---------+-----------+----------+--------------------+  Summary:  Left: No evidence of deep vein thrombosis in the upper extremity. No evidence of superficial vein thrombosis in the upper extremity.  *See table(s) above for measurements and observations.    Preliminary    DG Chest Portable 1 View Result Date: 04/28/2024 CLINICAL DATA:  End-stage renal disease. EXAM: PORTABLE CHEST 1 VIEW COMPARISON:  11/11/2022 FINDINGS: The lungs are clear without focal pneumonia, edema, pneumothorax or pleural effusion. The cardiopericardial silhouette is within normal limits for size. Right IJ central line tip overlies the upper right atrium. IMPRESSION: No active disease. Electronically Signed   By: Camellia Candle M.D.   On: 04/28/2024 12:14     Procedures   Medications Ordered in the ED  acetaminophen -codeine  (TYLENOL  #3) 300-30 MG per tablet 1 tablet (has no administration in time range)  HYDROcodone -acetaminophen  (NORCO/VICODIN) 5-325 MG per tablet 1 tablet (1 tablet Oral Given 04/28/24 1204)                                    Medical Decision Making Amount and/or Complexity of Data Reviewed Labs: ordered. Radiology: ordered.  Risk Prescription drug management.   This patient presents to the ED for concern of post op numbness, this involves a number of treatment options, and is a complaint that carries with it a high risk of complications and morbidity.  The  differential diagnosis includes hand ischemia, infection versus radiculopathy.    Co morbidities: Discussed in HPI   Brief History:  See HPI.   EMR reviewed including pt PMHx, past surgical history and past visits to ER.   See HPI for more details  Lab Tests:  I ordered and independently interpreted labs.  The pertinent results include:    I personally reviewed all laboratory work and imaging. Metabolic panel without any acute abnormality specifically kidney function within normal limits and no significant electrolyte abnormalities. CBC without leukocytosis or significant anemia.  Lactic acid normal.  Imaging Studies:  NAD. I personally reviewed all imaging studies and no acute abnormality found. I agree with radiology interpretation.  Medicines ordered:  I ordered medication including Norco for pain control Reevaluation of the patient after these medicines showed that the patient improved I have reviewed the patients home medicines and have made adjustments as needed  Consults:  I requested consultation with Dr. Rodrick,  and discussed lab and imaging findings as well as pertinent plan - they will evaluate patient while in the ED.   Reevaluation:  After the interventions noted above I re-evaluated patient and found that they have :improved  Social Determinants of Health:  The patient's social determinants of health were a factor in the care of this patient  Problem List / ED Course:  Patient presenting to the ED with a chief complaint of left arm numbness which occurred after placement of his AV fistula approximately 3 days ago.  This was done by Dr. Gretta, reports he has felt worsening numbness today.  He is also having pain and was previously prescribed Norco to help with pain control without much improvement in his symptoms.  He tells me that there is a numbing sensation along the medial aspect of his hand tracking down the forearm.  He has a reassuring 2+ radial  pulses, no finger numbness or hand pain at this time to suggest hand ischemia or steal syndrome.  Patient is overall well-appearing without any drainage to it, no discharge from it.  I have a low suspicion for infection at this time.  Blood work obtained did not show any leukocytosis.  CMP within his baseline creatinine levels.  His potassium is normal.  He is not complaining of any shortness of breath or need for emergent dialysis at this time. I discussed the case and the workup with vascular surgery Dr. Ethyl who came down to see the patient, they do not feel that this is likely ischemia at this time as his exam is reassuring.  I discussed with patient a short course of pain medication for home to help with his symptoms.  He is to follow-up with vascular surgery.  Ultrasound study without any signs of a blood clot at this time.  Patient remains hemodynamically stable for discharge.  Dispostion:  After consideration of the diagnostic results and the patients response to treatment, I feel that the patent would benefit from close follow-up with vascular surgeon.    Portions of this note were generated with Scientist, clinical (histocompatibility and immunogenetics). Dictation errors may occur despite best attempts at proofreading. ; Final diagnoses:  A-V fistula  Numbness    ED Discharge Orders          Ordered    acetaminophen -codeine  (TYLENOL  #3) 300-30 MG tablet  Every 6 hours PRN        04/28/24 1456               Dicie Edelen, PA-C 04/28/24 1501    Neysa Caron PARAS, DO 04/28/24 1536

## 2024-05-02 ENCOUNTER — Telehealth: Payer: Self-pay

## 2024-05-02 NOTE — Telephone Encounter (Signed)
 Patient called reporting continued numbness from elbow to wrist and mild swelling of fingers s/p Lt AVF placement on 12/10.  Patient reported continuing numbness and tingling in arm, feels like when your foot is asleep.  Reported he can hold a cup but can't lift anything heavy.  Pt reported arm and hand are warm.  Advised patient to not lift anything heavier than a gallon milk. Advised patient to use arm for daily activities. Advised patient to elevate his arm and hand when not using it. Advised patient to have dialysis center nurse assess hand tomorrow, 12/19, at dialysis.

## 2024-06-04 NOTE — Progress Notes (Unsigned)
" ° ° °  Postoperative Access Visit   History of Present Illness   Manuel Riggs is a 34 y.o. year old male who presents for postoperative follow-up for:  left first stage basilic vein transposition (brachiobasilic arteriovenous fistula) placement on 04/25/24 by Dr. Gretta.  The patient's wounds are well healed.  The patient notes no steal symptoms. He does report some forearm numbness. He currently dialyzes on TTS via a right internal jugular TDC.   Physical Examination   Vitals:   06/05/24 0835  BP: 135/83  Weight: 170 lb 14.4 oz (77.5 kg)  Height: 5' 6 (1.676 m)   Body mass index is 27.58 kg/m.  left arm Incision is well healed, 2+ radial pulse, hand grip is 5/5, sensation in digits is intact, palpable thrill, bruit can be auscultated     Non Invasive Vascular lab evaluation   VAS US  Duplex Dialysis Access (AVF, AVG): Findings:  +--------------------+----------+-----------------+--------+  AVF                PSV (cm/s)Flow Vol (mL/min)Comments  +--------------------+----------+-----------------+--------+  Native artery inflow   186          1138                 +--------------------+----------+-----------------+--------+  AVF Anastomosis        428                               +--------------------+----------+-----------------+--------+   +------------+----------+-------------+----------+--------+  OUTFLOW VEINPSV (cm/s)Diameter (cm)Depth (cm)Describe  +------------+----------+-------------+----------+--------+  Prox UA        316        0.48        0.71             +------------+----------+-------------+----------+--------+  Mid UA         175        0.47        0.41             +------------+----------+-------------+----------+--------+  Dist UA        257        0.34        0.32             +------------+----------+-------------+----------+--------+   Summary:  Patent brachial-basilic AVF, there is no evidence of stenosis.    Medical Decision Making   Manuel Riggs is a 34 y.o. year old male who presents s/p left first stage basilic vein transposition (brachiobasilic arteriovenous fistula) placement 04/25/24. Incision is well healed. He is without signs or symptoms of steal syndrome. He does have some forearm numbness which I discussed will hopefully resolve with time. His duplex shows patent fistula with excellent volume flow. The fistula however has not matured fully. I provided him with exercise ball and instructed him to exercise his arm. I will have him return in 1 month for repeat fistula duplex.   Teretha Damme, PA-C [facility] Office: [phone]  Clinic MD: Sheree "

## 2024-06-05 ENCOUNTER — Ambulatory Visit (INDEPENDENT_AMBULATORY_CARE_PROVIDER_SITE_OTHER): Admitting: Physician Assistant

## 2024-06-05 ENCOUNTER — Ambulatory Visit (HOSPITAL_COMMUNITY)
Admission: RE | Admit: 2024-06-05 | Discharge: 2024-06-05 | Disposition: A | Discharge status: Home / Self Care | Source: Ambulatory Visit | Attending: Vascular Surgery | Admitting: Vascular Surgery

## 2024-06-05 VITALS — BP 135/83 | Ht 66.0 in | Wt 170.9 lb

## 2024-06-05 DIAGNOSIS — Z992 Dependence on renal dialysis: Secondary | ICD-10-CM | POA: Diagnosis present

## 2024-06-05 DIAGNOSIS — N186 End stage renal disease: Secondary | ICD-10-CM | POA: Diagnosis present

## 2024-06-05 DIAGNOSIS — N184 Chronic kidney disease, stage 4 (severe): Secondary | ICD-10-CM | POA: Diagnosis not present

## 2024-06-06 ENCOUNTER — Other Ambulatory Visit: Payer: Self-pay

## 2024-06-06 DIAGNOSIS — N186 End stage renal disease: Secondary | ICD-10-CM

## 2024-07-06 ENCOUNTER — Ambulatory Visit

## 2024-07-06 ENCOUNTER — Ambulatory Visit (HOSPITAL_COMMUNITY)
